# Patient Record
Sex: Male | Born: 1980
Health system: Southern US, Community
[De-identification: ages and names within clinical notes are randomized; demographics above are authoritative.]

## PROBLEM LIST (undated history)

## (undated) DIAGNOSIS — E7800 Pure hypercholesterolemia, unspecified: Secondary | ICD-10-CM

## (undated) DIAGNOSIS — K219 Gastro-esophageal reflux disease without esophagitis: Secondary | ICD-10-CM

## (undated) HISTORY — PX: NOSE SURGERY: SHX723

---
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
## 1999-01-12 HISTORY — PX: ANKLE SURGERY: SHX546

## 1999-01-26 ENCOUNTER — Emergency Department (HOSPITAL_COMMUNITY): Admission: EM | Admit: 1999-01-26 | Discharge: 1999-01-27 | Payer: Self-pay | Admitting: Emergency Medicine

## 2000-01-15 ENCOUNTER — Ambulatory Visit (HOSPITAL_BASED_OUTPATIENT_CLINIC_OR_DEPARTMENT_OTHER): Admission: RE | Admit: 2000-01-15 | Discharge: 2000-01-15 | Payer: Self-pay | Admitting: Orthopedic Surgery

## 2001-11-05 ENCOUNTER — Emergency Department (HOSPITAL_COMMUNITY): Admission: EM | Admit: 2001-11-05 | Discharge: 2001-11-05 | Payer: Self-pay | Admitting: Emergency Medicine

## 2006-04-21 ENCOUNTER — Ambulatory Visit (HOSPITAL_COMMUNITY): Admission: RE | Admit: 2006-04-21 | Discharge: 2006-04-21 | Payer: Self-pay | Admitting: Internal Medicine

## 2008-02-07 ENCOUNTER — Emergency Department (HOSPITAL_COMMUNITY): Admission: EM | Admit: 2008-02-07 | Discharge: 2008-02-07 | Payer: Self-pay | Admitting: Emergency Medicine

## 2010-02-09 ENCOUNTER — Emergency Department (HOSPITAL_COMMUNITY)
Admission: EM | Admit: 2010-02-09 | Discharge: 2010-02-09 | Payer: Self-pay | Source: Home / Self Care | Admitting: Emergency Medicine

## 2010-05-29 NOTE — Op Note (Signed)
[place]  Patient:    David Webb, David Webb                       MRN: [id number] Proc. Date: 01/15/00 Adm. Date:  [id number] Attending:  Colbert Ewing                           Operative Report  PREOPERATIVE DIAGNOSIS:  Displaced lateral malleolus fracture, right ankle.  POSTOPERATIVE DIAGNOSIS:  Displaced lateral malleolus fracture, right ankle.  OPERATION PERFORMED:  Open reduction internal fixation lateral malleolus fracture, right ankle with a 6-hole, one third tubular Depuy titanium plate and screws.  SURGEON:  Loreta Ave, M.D.  ASSISTANT:  Arlys John D. Petrarca, P.A.-C.  ANESTHESIA:  General.  ESTIMATED BLOOD LOSS:  Minimal.  SPECIMENS:  None.  CULTURES:  None.  COMPLICATIONS:  None.  DRESSING:  Soft compressive with Cam walker.  TOURNIQUET TIME:  30 minutes.  DESCRIPTION OF PROCEDURE:  The patient was brought to the operating room and was placed on the operating table in supine position.  After adequate anesthesia had been obtained, tourniquet applied upper aspect right leg. Prepped and draped in the usual sterile fashion.  Exsanguinated with elevation and Esmarch.  Tourniquet inflated to 300 mmHg.  Straight incision over the posterolateral aspect of the fibula.  Subperiosteal exposure of the fibula, cleaning out the fracture site and protecting the peroneal tendons.  Anatomic reduction. A 6-hole one third tubular plate was fashioned to fit on the posterolateral aspect of the fibula.  Held in place with a clamp, holding the fracture reduced anatomically.  Fixed with three screws proximal and three distal to the fracture with one of the middle screws bridging across the fracture and placed in a lag manner.  Care taken not to enter the joint or syndesmosis with fixation or drills.  At completion I had anatomic alignment with solid stable fixation throughout.  This was confirmed visually as well as fluoroscopically.   Because of an old avulsion medially an eversion stress view was obtained  which showed no instability on the medial side.  Full passive motion without fracture site motion after fixation.  Wound irrigated.  Closed with Vicryl and nylon.  Margins of the wound injected with Marcaine.  Sterile compressive dressing applied.  Tourniquet deflated and removed.  Cam walker applied.  Anesthesia reversed.  Brought to recovery room.  Tolerated surgery well.  No complications. DD:  01/15/00 TD:  01/15/00 Job: [id number]

## 2011-04-08 ENCOUNTER — Encounter (INDEPENDENT_AMBULATORY_CARE_PROVIDER_SITE_OTHER): Payer: Self-pay | Admitting: General Surgery

## 2011-04-22 ENCOUNTER — Encounter (INDEPENDENT_AMBULATORY_CARE_PROVIDER_SITE_OTHER): Payer: Self-pay | Admitting: General Surgery

## 2011-04-22 ENCOUNTER — Ambulatory Visit (INDEPENDENT_AMBULATORY_CARE_PROVIDER_SITE_OTHER): Payer: BC Managed Care – PPO | Admitting: General Surgery

## 2011-04-22 VITALS — BP 136/82 | HR 75 | Temp 97.6°F | Ht 65.0 in | Wt 214.2 lb

## 2011-04-22 DIAGNOSIS — K4090 Unilateral inguinal hernia, without obstruction or gangrene, not specified as recurrent: Secondary | ICD-10-CM

## 2011-04-22 NOTE — Progress Notes (Signed)
HPI  David Webb. David Webb is a 31 y.o. male. This patient is known to our practice for a prior evaluation of a left inguinal hernia and left groin pain and was seen initially in 2011 for this. He was diagnosed with a left inguinal hernia and was offered laparoscopic inguinal hernias repair at that time by Dr. Freida Busman. He put off the surgery and since then he says that he has had increasing pain and increasing size of the bulge in his left groin. He first noticed this there half ago after lifting a heavy object and since then he has noticed a bulge in the area. He says this is increasing in size and is increased in size with prolonged standing or sitting. He says that initially this reduced when lying flat however he has had difficulty reducing the bulk lately. He says it's increased the size of a baseball and does cause some stomach pain and some discomfort with certain movements. Otherwise he denies any obstructive symptoms  HPI  Past Medical History   Diagnosis  Date   .  Hyperlipidemia    .  Colon polyp     History reviewed. No pertinent past surgical history.  Family History   Problem  Relation  Age of Onset   .  Stroke  Father    .  Alzheimer's disease  Mother     Social History  History   Substance Use Topics   .  Smoking status:  Never Smoker   .  Smokeless tobacco:  Not on file   .  Alcohol Use:  Yes      rarely    No Known Allergies  No current outpatient prescriptions on file.    Review of Systems  Review of Systems  All other review of systems negative or noncontributory except as stated in the HPI  Blood pressure 144/89, pulse 75, temperature 97.9 F (36.6 C), temperature source Temporal, height 6\' 1"  (1.854 m), weight 183 lb 12.8 oz (83.371 kg), SpO2 98.00%.  Physical Exam  Physical Exam  Physical Exam  Vitals reviewed.  Constitutional: He is oriented to person, place, and time. He appears well-developed and well-nourished. No distress.  HENT:  Head: Normocephalic and  atraumatic.  Mouth/Throat: No oropharyngeal exudate.  Eyes: Conjunctivae and EOM are normal. Pupils are equal, round, and reactive to light. Right eye exhibits no discharge. Left eye exhibits no discharge. No scleral icterus.  Neck: Normal range of motion. No tracheal deviation present.  Cardiovascular: Normal rate, regular rhythm and normal heart sounds.  Pulmonary/Chest: Effort normal and breath sounds normal. No stridor. No respiratory distress. He has no wheezes. He has no rales. He exhibits no tenderness.  Abdominal: Soft. Bowel sounds are normal. He exhibits no distension and no mass. There is no tenderness. There is no rebound and no guarding. Large LIH with descent into the scrotum. It is partially reducible today on exam but nontender and no evidence of strangulation. No RIH Musculoskeletal: Normal range of motion. He exhibits no edema and no tenderness.  Neurological: He is alert and oriented to person, place, and time.  Skin: Skin is warm and dry. No rash noted. He is not diaphoretic. No erythema. No pallor.  Psychiatric: He has a normal mood and affect. His behavior is normal. Judgment and thought content normal.  Data Reviewed  Assessment   Left inguinal hernia  Large Left inguinal hernia which is descending into the scrotum. Because of the size I think that laparoscopic repair is a relative  contraindication to and I think that an open repair would be best for this unilateral, large inguinal hernia. We discussed laparoscopic repair as well but again I think that he would be best suited with an open repair for this larger hernia. Also this is only partially reducible. There is no evidence of strangulation and no obstructive symptoms. We discussed the risks of procedure including infection, bleeding, pain, scarring, recurrence, nerve injury, chronic pain, bowel injury, injury to the testicle and vas deferens and he expressed understanding and desired to proceed with open left inguinal  hernia repair with mesh   Plan   Will plan for open LIH with mesh   [name] DAVID  04/22/2011, 9:56 AM

## 2011-04-26 ENCOUNTER — Ambulatory Visit (INDEPENDENT_AMBULATORY_CARE_PROVIDER_SITE_OTHER): Payer: Self-pay | Admitting: Surgery

## 2011-04-27 ENCOUNTER — Encounter (HOSPITAL_COMMUNITY): Payer: Self-pay | Admitting: Pharmacy Technician

## 2011-04-28 ENCOUNTER — Encounter (HOSPITAL_COMMUNITY)
Admission: RE | Admit: 2011-04-28 | Discharge: 2011-04-28 | Disposition: A | Discharge status: Home / Self Care | Payer: BC Managed Care – PPO | Source: Ambulatory Visit | Attending: General Surgery | Admitting: General Surgery

## 2011-04-28 ENCOUNTER — Encounter (HOSPITAL_COMMUNITY): Payer: Self-pay

## 2011-04-28 VITALS — BP 137/91 | HR 65 | Temp 97.1°F | Resp 20 | Ht 65.0 in | Wt 215.7 lb

## 2011-04-28 DIAGNOSIS — Z01812 Encounter for preprocedural laboratory examination: Secondary | ICD-10-CM | POA: Insufficient documentation

## 2011-04-28 DIAGNOSIS — K4090 Unilateral inguinal hernia, without obstruction or gangrene, not specified as recurrent: Secondary | ICD-10-CM

## 2011-04-28 DIAGNOSIS — Z538 Procedure and treatment not carried out for other reasons: Secondary | ICD-10-CM | POA: Insufficient documentation

## 2011-04-28 HISTORY — DX: Gastro-esophageal reflux disease without esophagitis: K21.9

## 2011-04-28 LAB — BASIC METABOLIC PANEL
BUN: 10 mg/dL (ref 6–23)
Creatinine, Ser: 1.01 mg/dL (ref 0.50–1.35)
GFR calc Af Amer: >90 mL/min (ref >90)
GFR calc non Af Amer: >90 mL/min (ref >90)
Glucose, Bld: 92 mg/dL (ref 70–99)
Potassium: 4.3 mEq/L (ref 3.5–5.1)

## 2011-04-28 LAB — CBC
HCT: 47.9 % (ref 39.0–52.0)
Hemoglobin: 16.5 g/dL (ref 13.0–17.0)
MCH: 30.4 pg (ref 26.0–34.0)
MCHC: 34.4 g/dL (ref 30.0–36.0)
RDW: 12.7 % (ref 11.5–15.5)

## 2011-04-28 LAB — SURGICAL PCR SCREEN
MRSA, PCR: NEGATIVE
Staphylococcus aureus: NEGATIVE

## 2011-04-28 NOTE — Pre-Procedure Instructions (Signed)
20 David Webb  04/28/2011   Your procedure is scheduled on:  Wednesday May 12, 2011  Report to [facility] at 0815 AM.  Call this number if you have problems the morning of surgery: [phone]   Remember:   Do not eat food:After Midnight.  May have clear liquids: up to 4 Hours before arrival. (up to 4:15am)  Clear liquids include soda, tea, black coffee, apple or grape juice, broth.  Take these medicines the morning of surgery with A SIP OF WATER: zyrtec   Do not wear jewelry, make-up or nail polish.  Do not wear lotions, powders, or perfumes. You may wear deodorant.  Do not shave 48 hours prior to surgery.  Do not bring valuables to the hospital.  Contacts, dentures or bridgework may not be worn into surgery.  Leave suitcase in the car. After surgery it may be brought to your room.  For patients admitted to the hospital, checkout time is 11:00 AM the day of discharge.   Patients discharged the day of surgery will not be allowed to drive home.  Name and phone number of your driver: FAMILY\ FRIEND  Special Instructions: CHG Shower Use Special Wash: 1/2 bottle night before surgery and 1/2 bottle morning of surgery.   Please read over the following fact sheets that you were given: Pain Booklet, Coughing and Deep Breathing, MRSA Information and Surgical Site Infection Prevention

## 2011-05-11 MED ORDER — CEFAZOLIN SODIUM-DEXTROSE 2-3 GM-% IV SOLR
2.0000 g | INTRAVENOUS | Status: DC
  Filled 2011-05-11: qty 50

## 2011-05-12 ENCOUNTER — Ambulatory Visit (HOSPITAL_COMMUNITY): Payer: BC Managed Care – PPO

## 2011-05-12 ENCOUNTER — Encounter (HOSPITAL_COMMUNITY)
Admission: RE | Disposition: A | Discharge status: Home / Self Care | Payer: Self-pay | Source: Ambulatory Visit | Attending: General Surgery

## 2011-05-12 ENCOUNTER — Ambulatory Visit (HOSPITAL_COMMUNITY): Payer: BC Managed Care – PPO | Admitting: Anesthesiology

## 2011-05-12 ENCOUNTER — Encounter (HOSPITAL_COMMUNITY): Payer: Self-pay | Admitting: Surgery

## 2011-05-12 ENCOUNTER — Encounter (HOSPITAL_COMMUNITY): Payer: Self-pay | Admitting: Anesthesiology

## 2011-05-12 ENCOUNTER — Ambulatory Visit (HOSPITAL_COMMUNITY)
Admission: RE | Admit: 2011-05-12 | Discharge: 2011-05-12 | Disposition: A | Discharge status: Home / Self Care | Payer: BC Managed Care – PPO | Source: Ambulatory Visit | Attending: General Surgery | Admitting: General Surgery

## 2011-05-12 DIAGNOSIS — K4030 Unilateral inguinal hernia, with obstruction, without gangrene, not specified as recurrent: Secondary | ICD-10-CM

## 2011-05-12 DIAGNOSIS — K4090 Unilateral inguinal hernia, without obstruction or gangrene, not specified as recurrent: Secondary | ICD-10-CM

## 2011-05-12 DIAGNOSIS — K219 Gastro-esophageal reflux disease without esophagitis: Secondary | ICD-10-CM | POA: Insufficient documentation

## 2011-05-12 HISTORY — PX: INGUINAL HERNIA REPAIR: SHX194

## 2011-05-12 SURGERY — REPAIR, HERNIA, INGUINAL, ADULT
Anesthesia: General | Site: Abdomen | Laterality: Left | Wound class: Clean

## 2011-05-12 MED ORDER — ONDANSETRON HCL 4 MG/2ML IJ SOLN
INTRAMUSCULAR | Status: AC
  Administered 2011-05-12: 4 mg
  Filled 2011-05-12: qty 2

## 2011-05-12 MED ORDER — ONDANSETRON HCL 4 MG/2ML IJ SOLN
4.0000 mg | Freq: Once | INTRAMUSCULAR | Status: AC | PRN
  Administered 2011-05-12: 4 mg via INTRAVENOUS

## 2011-05-12 MED ORDER — CEFAZOLIN SODIUM 1-5 GM-% IV SOLN
INTRAVENOUS | Status: DC | PRN
  Administered 2011-05-12: 2 g via INTRAVENOUS

## 2011-05-12 MED ORDER — ROCURONIUM BROMIDE 100 MG/10ML IV SOLN
INTRAVENOUS | Status: DC | PRN
  Administered 2011-05-12: 50 mg via INTRAVENOUS

## 2011-05-12 MED ORDER — HYDROCODONE-ACETAMINOPHEN 5-325 MG PO TABS: 1.0000 | ORAL_TABLET | Freq: Four times a day (QID) | ORAL | Status: AC | PRN

## 2011-05-12 MED ORDER — HYDROMORPHONE HCL PF 1 MG/ML IJ SOLN
0.2500 mg | INTRAMUSCULAR | Status: DC | PRN
  Administered 2011-05-12 (×4): 0.5 mg via INTRAVENOUS

## 2011-05-12 MED ORDER — LACTATED RINGERS IV SOLN
INTRAVENOUS | Status: DC
  Administered 2011-05-12: 14:00:00 via INTRAVENOUS

## 2011-05-12 MED ORDER — HYDROMORPHONE HCL PF 1 MG/ML IJ SOLN
INTRAMUSCULAR | Status: AC
  Filled 2011-05-12: qty 1

## 2011-05-12 MED ORDER — 0.9 % SODIUM CHLORIDE (POUR BTL) OPTIME
TOPICAL | Status: DC | PRN
  Administered 2011-05-12: 1000 mL

## 2011-05-12 MED ORDER — SUCCINYLCHOLINE CHLORIDE 20 MG/ML IJ SOLN
INTRAMUSCULAR | Status: DC | PRN
  Administered 2011-05-12: 100 mg via INTRAVENOUS

## 2011-05-12 MED ORDER — DROPERIDOL 2.5 MG/ML IJ SOLN
INTRAMUSCULAR | Status: AC
  Administered 2011-05-12: 0.625 mg
  Filled 2011-05-12: qty 2

## 2011-05-12 MED ORDER — NEOSTIGMINE METHYLSULFATE 1 MG/ML IJ SOLN
INTRAMUSCULAR | Status: DC | PRN
  Administered 2011-05-12: 5 mg via INTRAVENOUS

## 2011-05-12 MED ORDER — FENTANYL CITRATE 0.05 MG/ML IJ SOLN
INTRAMUSCULAR | Status: DC | PRN
  Administered 2011-05-12: 100 ug via INTRAVENOUS
  Administered 2011-05-12: 50 ug via INTRAVENOUS
  Administered 2011-05-12: 100 ug via INTRAVENOUS

## 2011-05-12 MED ORDER — LACTATED RINGERS IV SOLN
INTRAVENOUS | Status: DC | PRN
  Administered 2011-05-12 (×3): via INTRAVENOUS

## 2011-05-12 MED ORDER — MIDAZOLAM HCL 5 MG/5ML IJ SOLN
INTRAMUSCULAR | Status: DC | PRN
  Administered 2011-05-12: 2 mg via INTRAVENOUS

## 2011-05-12 MED ORDER — GLYCOPYRROLATE 0.2 MG/ML IJ SOLN
INTRAMUSCULAR | Status: DC | PRN
  Administered 2011-05-12: .8 mg via INTRAVENOUS

## 2011-05-12 MED ORDER — MORPHINE SULFATE 2 MG/ML IJ SOLN: 0.0500 mg/kg | INTRAMUSCULAR | Status: DC | PRN

## 2011-05-12 MED ORDER — PROPOFOL 10 MG/ML IV EMUL
INTRAVENOUS | Status: DC | PRN
  Administered 2011-05-12: 100 mg via INTRAVENOUS

## 2011-05-12 MED ORDER — ONDANSETRON HCL 4 MG/2ML IJ SOLN
INTRAMUSCULAR | Status: DC | PRN
  Administered 2011-05-12: 4 mg via INTRAVENOUS

## 2011-05-12 MED ORDER — LIDOCAINE HCL 4 % MT SOLN
OROMUCOSAL | Status: DC | PRN
  Administered 2011-05-12: 4 mL via TOPICAL

## 2011-05-12 MED ORDER — LIDOCAINE-EPINEPHRINE (PF) 1 %-1:200000 IJ SOLN
INTRAMUSCULAR | Status: DC | PRN
  Administered 2011-05-12: 16:00:00

## 2011-05-12 SURGICAL SUPPLY — 50 items
ADH SKN CLS APL DERMABOND .7 (GAUZE/BANDAGES/DRESSINGS) ×1
BLADE SURG 10 STRL SS (BLADE) ×2 IMPLANT
BLADE SURG 15 STRL LF DISP TIS (BLADE) ×1 IMPLANT
BLADE SURG 15 STRL SS (BLADE) ×2
BLADE SURG ROTATE 9660 (MISCELLANEOUS) ×1 IMPLANT
CANISTER SUCTION 2500CC (MISCELLANEOUS) IMPLANT
CHLORAPREP W/TINT 26ML (MISCELLANEOUS) ×2 IMPLANT
CLOTH BEACON ORANGE TIMEOUT ST (SAFETY) ×2 IMPLANT
COVER SURGICAL LIGHT HANDLE (MISCELLANEOUS) ×2 IMPLANT
DERMABOND ADVANCED (GAUZE/BANDAGES/DRESSINGS) ×1
DERMABOND ADVANCED .7 DNX12 (GAUZE/BANDAGES/DRESSINGS) ×1 IMPLANT
DRAIN PENROSE 1/2X12 LTX STRL (WOUND CARE) ×1 IMPLANT
DRAPE LAPAROSCOPIC ABDOMINAL (DRAPES) ×2 IMPLANT
ELECT CAUTERY BLADE 6.4 (BLADE) ×2 IMPLANT
ELECT REM PT RETURN 9FT ADLT (ELECTROSURGICAL) ×2
ELECTRODE REM PT RTRN 9FT ADLT (ELECTROSURGICAL) ×1 IMPLANT
GLOVE BIOGEL PI IND STRL 7.0 (GLOVE) IMPLANT
GLOVE BIOGEL PI INDICATOR 7.0 (GLOVE) ×2
GLOVE ECLIPSE 6.5 STRL STRAW (GLOVE) ×1 IMPLANT
GLOVE SURG SS PI 7.0 STRL IVOR (GLOVE) ×1 IMPLANT
GLOVE SURG SS PI 7.5 STRL IVOR (GLOVE) ×5 IMPLANT
GOWN PREVENTION PLUS XLARGE (GOWN DISPOSABLE) ×2 IMPLANT
GOWN STRL NON-REIN LRG LVL3 (GOWN DISPOSABLE) ×3 IMPLANT
KIT BASIN OR (CUSTOM PROCEDURE TRAY) ×2 IMPLANT
KIT ROOM TURNOVER OR (KITS) ×2 IMPLANT
MANIFOLD NEPTUNE II (INSTRUMENTS) ×1 IMPLANT
MESH ULTRAPRO 3X6 7.6X15CM (Mesh General) ×1 IMPLANT
NDL HYPO 25GX1X1/2 BEV (NEEDLE) ×1 IMPLANT
NEEDLE HYPO 25GX1X1/2 BEV (NEEDLE) ×2 IMPLANT
NS IRRIG 1000ML POUR BTL (IV SOLUTION) ×2 IMPLANT
PACK SURGICAL SETUP 50X90 (CUSTOM PROCEDURE TRAY) ×2 IMPLANT
PAD ARMBOARD 7.5X6 YLW CONV (MISCELLANEOUS) ×2 IMPLANT
PENCIL BUTTON HOLSTER BLD 10FT (ELECTRODE) ×2 IMPLANT
SPECIMEN JAR MEDIUM (MISCELLANEOUS) ×1 IMPLANT
SPECIMEN JAR SMALL (MISCELLANEOUS) IMPLANT
SPONGE INTESTINAL PEANUT (DISPOSABLE) ×2 IMPLANT
SPONGE LAP 18X18 X RAY DECT (DISPOSABLE) ×2 IMPLANT
SUT MNCRL AB 4-0 PS2 18 (SUTURE) ×2 IMPLANT
SUT PROLENE 2 0 SH DA (SUTURE) ×8 IMPLANT
SUT VIC AB 2-0 SH 27 (SUTURE) ×4
SUT VIC AB 2-0 SH 27XBRD (SUTURE) ×2 IMPLANT
SUT VIC AB 3-0 SH 27 (SUTURE) ×4
SUT VIC AB 3-0 SH 27X BRD (SUTURE) ×1 IMPLANT
SUT VICRYL AB 2 0 TIES (SUTURE) ×1 IMPLANT
SYR BULB 3OZ (MISCELLANEOUS) ×2 IMPLANT
SYR CONTROL 10ML LL (SYRINGE) ×2 IMPLANT
TOWEL OR 17X24 6PK STRL BLUE (TOWEL DISPOSABLE) ×2 IMPLANT
TOWEL OR 17X26 10 PK STRL BLUE (TOWEL DISPOSABLE) ×2 IMPLANT
TUBE CONNECTING 12X1/4 (SUCTIONS) ×1 IMPLANT
YANKAUER SUCT BULB TIP NO VENT (SUCTIONS) ×1 IMPLANT

## 2011-05-12 NOTE — H&P (View-Only) (Signed)
HPI  David Webb is a 31 y.o. male. This patient is known to our practice for a prior evaluation of a left inguinal hernia and left groin pain and was seen initially in 2011 for this. He was diagnosed with a left inguinal hernia and was offered laparoscopic inguinal hernias repair at that time by Dr. Allen. He put off the surgery and since then he says that he has had increasing pain and increasing size of the bulge in his left groin. He first noticed this there half ago after lifting a heavy object and since then he has noticed a bulge in the area. He says this is increasing in size and is increased in size with prolonged standing or sitting. He says that initially this reduced when lying flat however he has had difficulty reducing the bulk lately. He says it's increased the size of a baseball and does cause some stomach pain and some discomfort with certain movements. Otherwise he denies any obstructive symptoms  HPI  Past Medical History   Diagnosis  Date   .  Hyperlipidemia    .  Colon polyp     History reviewed. No pertinent past surgical history.  Family History   Problem  Relation  Age of Onset   .  Stroke  Father    .  Alzheimer's disease  Mother     Social History  History   Substance Use Topics   .  Smoking status:  Never Smoker   .  Smokeless tobacco:  Not on file   .  Alcohol Use:  Yes      rarely    No Known Allergies  No current outpatient prescriptions on file.    Review of Systems  Review of Systems  All other review of systems negative or noncontributory except as stated in the HPI  Blood pressure 144/89, pulse 75, temperature 97.9 F (36.6 C), temperature source Temporal, height 6' 1" (1.854 m), weight 183 lb 12.8 oz (83.371 kg), SpO2 98.00%.  Physical Exam  Physical Exam  Physical Exam  Vitals reviewed.  Constitutional: He is oriented to person, place, and time. He appears well-developed and well-nourished. No distress.  HENT:  Head: Normocephalic and  atraumatic.  Mouth/Throat: No oropharyngeal exudate.  Eyes: Conjunctivae and EOM are normal. Pupils are equal, round, and reactive to light. Right eye exhibits no discharge. Left eye exhibits no discharge. No scleral icterus.  Neck: Normal range of motion. No tracheal deviation present.  Cardiovascular: Normal rate, regular rhythm and normal heart sounds.  Pulmonary/Chest: Effort normal and breath sounds normal. No stridor. No respiratory distress. He has no wheezes. He has no rales. He exhibits no tenderness.  Abdominal: Soft. Bowel sounds are normal. He exhibits no distension and no mass. There is no tenderness. There is no rebound and no guarding. Large LIH with descent into the scrotum. It is partially reducible today on exam but nontender and no evidence of strangulation. No RIH Musculoskeletal: Normal range of motion. He exhibits no edema and no tenderness.  Neurological: He is alert and oriented to person, place, and time.  Skin: Skin is warm and dry. No rash noted. He is not diaphoretic. No erythema. No pallor.  Psychiatric: He has a normal mood and affect. His behavior is normal. Judgment and thought content normal.  Data Reviewed  Assessment   Left inguinal hernia  Large Left inguinal hernia which is descending into the scrotum. Because of the size I think that laparoscopic repair is a relative   contraindication to and I think that an open repair would be best for this unilateral, large inguinal hernia. We discussed laparoscopic repair as well but again I think that he would be best suited with an open repair for this larger hernia. Also this is only partially reducible. There is no evidence of strangulation and no obstructive symptoms. We discussed the risks of procedure including infection, bleeding, pain, scarring, recurrence, nerve injury, chronic pain, bowel injury, injury to the testicle and vas deferens and he expressed understanding and desired to proceed with open left inguinal  hernia repair with mesh   Plan   Will plan for open LIH with mesh   Daryn Pisani DAVID  04/22/2011, 9:56 AM  

## 2011-05-12 NOTE — Transfer of Care (Signed)
Immediate Anesthesia Transfer of Care Note  Patient: David Webb  Procedure(s) Performed: Procedure(s) (LRB): HERNIA REPAIR INGUINAL ADULT (Left) INSERTION OF MESH (Left)  Patient Location: PACU  Anesthesia Type: General  Level of Consciousness: awake, alert  and oriented  Airway & Oxygen Therapy: Patient Spontanous Breathing and Patient connected to nasal cannula oxygen  Post-op Assessment: Report given to PACU RN and Post -op Vital signs reviewed and stable  Post vital signs: Reviewed and stable  Complications: No apparent anesthesia complications

## 2011-05-12 NOTE — Brief Op Note (Signed)
Procedure(s): HERNIA REPAIR INGUINAL ADULT INSERTION OF MESH Procedure Note  ROCKFORD LEINEN male 31 y.o. 05/12/2011  Anesthesia: General endotracheal anesthesia   Surgeon(s) and Role:    * Lodema Pilot, DO - Primary   Indications: LIH     Surgeon: Lodema Pilot DAVID   Assistants: none  Anesthesia: General endotracheal anesthesia  ASA Class:     Procedure Detail  HERNIA REPAIR INGUINAL ADULT, INSERTION OF MESH  Findings: Large indirect LIH with omentum (excised) and high ligation performed  Estimated Blood Loss:  less than 100 mL         Drains: none         Total IV Fluids:  Blood Given: none          Specimens: hernia sac and contents         Implants: mesh        Complications:  * No complications entered in OR log *         Disposition: PACU - hemodynamically stable.         Condition: stable

## 2011-05-12 NOTE — Progress Notes (Signed)
DR WYATT NOTIFIED OF CLIENT UNABLE TO VOID AND BLADDER SCAN DONE PER DR WYATT AND DR WYATT NOTIFIED OF 239CC IN BLADDER PER BLADDER SCAN AND PER DR WYATT OK TO D/C HOME

## 2011-05-12 NOTE — Op Note (Signed)
David Webb, Webb                ACCOUNT NO.:  [id number]  MEDICAL RECORD NO.:  [id number]  LOCATION:  [facility]                         FACILITY:  [facility]  PHYSICIAN:  Lodema Pilot, MD       DATE OF BIRTH:  Feb 27, 1980  DATE OF PROCEDURE:  05/12/2011 DATE OF DISCHARGE:                              OPERATIVE REPORT   PROCEDURE:  Open repair of incarcerated left inguinal hernia with mesh.  PREOPERATIVE DIAGNOSIS:  Incarcerated inguinal hernia.  POSTOPERATIVE DIAGNOSIS:  Incarcerated inguinal hernia.  SURGEON:  Lodema Pilot, MD  ASSISTANT:  None.  ANESTHESIA:  General endotracheal tube anesthesia with 30 mL of 1% lidocaine with epinephrine and 0.25% Marcaine in a 50:50 mixture.  FLUIDS:  2 L of crystalloid.  ESTIMATED BLOOD LOSS:  Less than 100 mL.  DRAINS:  None.  SPECIMENS:  Hernia sac and contents sent to Pathology for permanent sectioning.  COMPLICATIONS:  None apparent.  FINDINGS:  Large sac with omental fat.  The sac was opened and the fat was ligated, and high ligation of sac was performed with placement of a 3 inch x 6 inch UltraPro mesh.  INDICATION FOR PROCEDURE:  David Webb is a 31 year old male who has a longstanding left inguinal hernia.  He was seen last year and was offered hernia repair, was elected not to undergo the surgery at that time.  He states it has been increasing in size.  OPERATIVE DETAILS:  David Webb was seen and evaluated in the preoperative area, and risks and benefits of procedure were again discussed in lay terms and informed consent was obtained.  Surgical site was marked prior to anesthetic administration.  During preop discussion, it was concerned that this might be hydrocele, another hernia, so we obtained an ultrasound, which confirmed fat in the scrotum consistent with an inguinal hernia.  He was then taken to the operating room, placed on table in supine position, and general endotracheal anesthesia was obtained.  Prophylactic  antibiotics were given, and his scrotum and groin were prepped in standard surgical fashion.  The procedure time-out was performed with all operative team members to confirm proper patient, procedure, and oblique incision was made in the skin over the inguinal canal.  Dissection carried down through subcutaneous tissue using Bovie electrocautery, and the external oblique fascia was opened along the length of its fibers, and immediately underlying the external oblique fascia was a large inguinal hernia.  It was difficult to identify the ilioinguinal nerve.  Due to the large size of the hernia.  I was unable to reduce the hernia, and so I was able to dissect the around the cord and the hernia at the pubic tubercle.  I opened the cremasteric fibers longitudinally and I was able to identify his cord structures and bluntly separate these from the hernia.  He actually had a very thick inguinal hernia sac, and so the cord structures were actually easy to identify and separate from the hernia sac.  The hernia was pulled up out of the scrotum, and the apex of the hernia sac was identified, and the hernia was completely dissected free from the spermatic cord, and the Penrose was replaced to adjust around  the spermatic cord.  The hernia sac and contents were able to be elevated, and his pendulous sac, which tapered down to a fairly small opening at the at the base of the spermatic cord at the internal ring.  There was no direct inguinal hernia identified.  This was a large indirect inguinal hernia.  After the sac was separated from the cord structures, I opened the hernia sac and was able to inspect the hernia contents.  There was no intestinal contents in the sac, but a large amount of omentum.  I broke this up and divided the omentum at its base with using 3 bites of the omentum and ligating it with Kelly clamps and 2-0 Vicryl ligatures.  After I divided this omental fat, the remainder of the fat  was easily reduced back into the peritoneum, and the hernia sac was ligated at its base with a 2-0 Vicryl stick tie.  Then, the wound was irrigated with sterile saline solution and inspected for hemostasis, which was noted be adequate, and a 3 inch x 6 inch UltraPro mesh was tailored to fit the inguinal canal and sutured in place at the pubic tubercle with a 2-0 Prolene suture, and this was run along the shelving edge of the inguinal ligament and tied laterally to the internal ring then interrupted 2-0 Prolene sutures were used to fix the mesh medially and cephalad, and the tails were split in the lateral aspect of the mesh, and the tails were passed around the spermatic cord, and a new internal ring was created.  2-0 Prolene sutures were used to approximate the tails of the mesh around the spermatic cord creating a new internal ring, and they were overlapping laterally.  The wound was again irrigated with sterile saline solution, and the wound was noted be hemostatic.  The external oblique fascia was then approximated with a running 2-0 Vicryl suture and Scarpa's fascia was approximated with 3-0 Vicryl suture.  The dermis was then approximated with interrupted 3-0 Vicryl sutures and the skin edges were approximated with 4-0 Monocryl subcuticular suture.  Skin was washed and dried and Dermabond was applied.  All sponge, needle, and instrument counts were correct at the end of the case.  The patient tolerated the procedure well without apparent complication.          ______________________________ Lodema Pilot, MD     BL/MEDQ  D:  05/12/2011  T:  05/12/2011  Job:  [id number]

## 2011-05-12 NOTE — Anesthesia Procedure Notes (Signed)
Procedure Name: Intubation Date/Time: 05/12/2011 2:02 PM Performed by: Glendora Score A Pre-anesthesia Checklist: Patient identified, Emergency Drugs available, Suction available and Patient being monitored Patient Re-evaluated:Patient Re-evaluated prior to inductionOxygen Delivery Method: Circle system utilized Preoxygenation: Pre-oxygenation with 100% oxygen Intubation Type: IV induction, Rapid sequence and Cricoid Pressure applied Ventilation: Mask ventilation without difficulty Laryngoscope Size: Miller and 2 Grade View: Grade I Tube type: Oral Tube size: 7.5 mm Number of attempts: 1 Airway Equipment and Method: Stylet and LTA kit utilized Placement Confirmation: ETT inserted through vocal cords under direct vision,  positive ETCO2 and breath sounds checked- equal and bilateral Secured at: 23 cm Tube secured with: Tape Dental Injury: Teeth and Oropharynx as per pre-operative assessment

## 2011-05-12 NOTE — Interval H&P Note (Signed)
Pt seen and examined in preop area and site marked for [facility].  Upon further review, I was concerned that this may be due to possible hydrocele so we obtained US which confirmed fat in inguinal canal and scrotum.  Risks of infection, bleeding, pain, scarring, recurrence, bowel injury, injury to testicle or vas deferens, and nerve injury discussed and he desires to proceed with open LIH with mesh.

## 2011-05-12 NOTE — Discharge Instructions (Signed)
Hernia Repair Care After These instructions give you information on caring for yourself after your procedure. Your doctor may also give you more specific instructions. Call your doctor if you have any problems or questions after your procedure. HOME CARE   You may have changes in your poops (bowel movements).   You may have loose or watery poop (diarrhea).   You may be not able to poop.   Your bowels will slowly get back to normal.   Do not eat any food that makes you sick to your stomach (nauseous). Eat small meals 4 to 6 times a day instead of 3 large ones.   Do not drink pop. It will give you gas.   Do not drink alcohol.   Do not lift anything heavier than 10 pounds. This is about the weight of a gallon of milk.   Do not do anything that makes you very tired for at least 6 weeks.   Do not get your wound wet for 2 days.   You may take a sponge bath during this time.   After 2 days you may take a shower. Gently pat your surgical cut (incision) dry with a towel. Do not rub it.   For men: You may have been given an athletic supporter (scrotal support) before you left the hospital. It holds your scrotum and testicles closer to your body so there is no strain on your wound. Wear the supporter until your doctor tells you that you do not need it anymore.  GET HELP RIGHT AWAY IF:  You have watery poop, or cannot poop for more than 3 days.   You feel sick to your stomach or throw up (vomit) more than 2 or 3 times.   You have temperature by mouth above 102 F (38.9 C).   You see redness or puffiness (swelling) around your wound.   You see yellowish white fluid (pus) coming from your wound.   You see a bulge or bump in your lower belly (abdomen) or near your groin.   You develop a rash, trouble breathing, or any other symptoms from medicines taken.  MAKE SURE YOU:  Understand these instructions.   Will watch your condition.   Will get help right away if your are not doing  well or get worse.  Document Released: 12/11/2007 Document Revised: 12/17/2010 Document Reviewed: 12/11/2007 [employer] Patient Information 2012 [employer]. 

## 2011-05-12 NOTE — Anesthesia Postprocedure Evaluation (Signed)
Anesthesia Post Note  Patient: David Webb  Procedure(s) Performed: Procedure(s) (LRB): HERNIA REPAIR INGUINAL ADULT (Left) INSERTION OF MESH (Left)  Anesthesia type: general  Patient location: PACU  Post pain: Pain level controlled  Post assessment: Patient's Cardiovascular Status Stable  Last Vitals:  Filed Vitals:   05/12/11 1700  BP: 130/65  Pulse:   Temp:   Resp:     Post vital signs: Reviewed and stable  Level of consciousness: sedated  Complications: No apparent anesthesia complications

## 2011-05-12 NOTE — Anesthesia Preprocedure Evaluation (Addendum)
Anesthesia Evaluation  Patient identified by MRN, date of birth, ID band Patient awake    Reviewed: Allergy & Precautions, H&P , NPO status   Airway Mallampati: I TM Distance: >3 FB Neck ROM: Full  Mouth opening: Limited Mouth Opening  Dental   Pulmonary          Cardiovascular     Neuro/Psych    GI/Hepatic GERD-  Poorly Controlled,  Endo/Other    Renal/GU      Musculoskeletal   Abdominal   Peds  Hematology   Anesthesia Other Findings   Reproductive/Obstetrics                           Anesthesia Physical Anesthesia Plan  ASA: II  Anesthesia Plan: General   Post-op Pain Management:    Induction: Intravenous, Rapid sequence and Cricoid pressure planned  Airway Management Planned: Oral ETT  Additional Equipment:   Intra-op Plan:   Post-operative Plan: Extubation in OR  Informed Consent: I have reviewed the patients History and Physical, chart, labs and discussed the procedure including the risks, benefits and alternatives for the proposed anesthesia with the patient or authorized representative who has indicated his/her understanding and acceptance.   Dental advisory given  Plan Discussed with: CRNA, Surgeon and Anesthesiologist  Anesthesia Plan Comments:        Anesthesia Quick Evaluation

## 2011-05-12 NOTE — Preoperative (Signed)
Beta Blockers   Reason not to administer Beta Blockers:Not Applicable 

## 2011-05-13 NOTE — Progress Notes (Signed)
Patient states he is still having severe pain. Patient reports he has stronger hydrocodone from dental procedure. Advised patient that if he was going to take it to follow directions exactly and to contact Dr. Delice Lesch office in am

## 2011-05-14 ENCOUNTER — Telehealth (INDEPENDENT_AMBULATORY_CARE_PROVIDER_SITE_OTHER): Payer: Self-pay

## 2011-05-14 NOTE — Telephone Encounter (Signed)
The patient called regarding his postoperative pain.  He is taking Hydrocodone 5/325 and it isn't strong enough for his pain.  He has some Hydrocodone 7.5/500 that does help.  He has no fever but has some swelling and bruising.  He can't take Percocet because it causes him to sweat and have dreams.  I paged Dr Biagio Quint and he said he can take the 7.5.  I asked if I can call in some if he needs it and says whatever he needs is fine.  I called in Hydrocodone 7.5/500 take 1 po q 4-6 hrs prn pain #30 no refills to [facility] on [address].  Pt was notified.

## 2011-05-18 ENCOUNTER — Encounter (HOSPITAL_COMMUNITY): Payer: Self-pay | Admitting: General Surgery

## 2011-05-31 ENCOUNTER — Ambulatory Visit (INDEPENDENT_AMBULATORY_CARE_PROVIDER_SITE_OTHER): Payer: BC Managed Care – PPO | Admitting: General Surgery

## 2011-05-31 ENCOUNTER — Encounter (INDEPENDENT_AMBULATORY_CARE_PROVIDER_SITE_OTHER): Payer: Self-pay | Admitting: General Surgery

## 2011-05-31 VITALS — BP 132/86 | HR 62 | Temp 97.8°F | Resp 16 | Ht 65.0 in | Wt 213.0 lb

## 2011-05-31 DIAGNOSIS — Z5189 Encounter for other specified aftercare: Secondary | ICD-10-CM

## 2011-05-31 DIAGNOSIS — Z4889 Encounter for other specified surgical aftercare: Secondary | ICD-10-CM

## 2011-05-31 NOTE — Progress Notes (Signed)
Subjective:     Patient ID: AMER ALCINDOR, male   DOB: Mar 14, 1980, 31 y.o.   MRN: [id number]  HPI This patient has a three-week status post open repair of incarcerated left inguinal hernia. He seems to be doing well and has noticed recurrence his only complaints are some lack of appetite and some numbness and burning in the crease of his groin. He has returned to work but has been having coworkers due to lifting for him.  Review of Systems     Objective:   Physical Exam No acute distress and nontoxic-appearing  His incision is healing well without infection. He has no evidence of recurrent hernia. No evidence of postoperative complications.    Assessment:     Status post open repair of incarcerated left inguinal hernia He has some postoperative numbness and burning which is likely due to nerve transection. I explained that this may or may not improve over time. I think that is anorexia will likely improve with more time. There is no evidence of any postoperative complications and overall seems to be doing well. I recommended that he continue light duty for another week and then he can gradually increase his activity as tolerated.    Plan:     F/u prn

## 2011-06-22 ENCOUNTER — Other Ambulatory Visit (HOSPITAL_COMMUNITY): Payer: BC Managed Care – PPO

## 2013-01-22 ENCOUNTER — Other Ambulatory Visit: Payer: Self-pay | Admitting: Family Medicine

## 2013-01-22 DIAGNOSIS — R748 Abnormal levels of other serum enzymes: Secondary | ICD-10-CM

## 2013-01-23 ENCOUNTER — Ambulatory Visit
Admission: RE | Admit: 2013-01-23 | Discharge: 2013-01-23 | Disposition: A | Discharge status: Home / Self Care | Payer: BC Managed Care – PPO | Source: Ambulatory Visit | Attending: Family Medicine | Admitting: Family Medicine

## 2013-01-23 DIAGNOSIS — R748 Abnormal levels of other serum enzymes: Secondary | ICD-10-CM

## 2014-10-16 ENCOUNTER — Emergency Department (HOSPITAL_COMMUNITY)
Admission: EM | Admit: 2014-10-16 | Discharge: 2014-10-16 | Disposition: A | Discharge status: Home / Self Care | Payer: BLUE CROSS/BLUE SHIELD | Attending: Emergency Medicine | Admitting: Emergency Medicine

## 2014-10-16 ENCOUNTER — Encounter (HOSPITAL_COMMUNITY): Payer: Self-pay | Admitting: *Deleted

## 2014-10-16 ENCOUNTER — Emergency Department (HOSPITAL_COMMUNITY): Payer: BLUE CROSS/BLUE SHIELD

## 2014-10-16 DIAGNOSIS — Z79899 Other long term (current) drug therapy: Secondary | ICD-10-CM | POA: Insufficient documentation

## 2014-10-16 DIAGNOSIS — I10 Essential (primary) hypertension: Secondary | ICD-10-CM | POA: Diagnosis not present

## 2014-10-16 DIAGNOSIS — R079 Chest pain, unspecified: Secondary | ICD-10-CM | POA: Diagnosis not present

## 2014-10-16 DIAGNOSIS — Z8639 Personal history of other endocrine, nutritional and metabolic disease: Secondary | ICD-10-CM | POA: Diagnosis not present

## 2014-10-16 DIAGNOSIS — K219 Gastro-esophageal reflux disease without esophagitis: Secondary | ICD-10-CM | POA: Insufficient documentation

## 2014-10-16 DIAGNOSIS — R55 Syncope and collapse: Secondary | ICD-10-CM

## 2014-10-16 HISTORY — DX: Pure hypercholesterolemia, unspecified: E78.00

## 2014-10-16 LAB — BASIC METABOLIC PANEL
ANION GAP: 12 (ref 5–15)
BUN: 9 mg/dL (ref 6–20)
CO2: 24 mmol/L (ref 22–32)
Calcium: 9.1 mg/dL (ref 8.9–10.3)
Chloride: 98 mmol/L — ABNORMAL LOW (ref 101–111)
Creatinine, Ser: 1.14 mg/dL (ref 0.61–1.24)
GLUCOSE: 103 mg/dL — AB (ref 65–99)
POTASSIUM: 3.5 mmol/L (ref 3.5–5.1)
Sodium: 134 mmol/L — ABNORMAL LOW (ref 135–145)

## 2014-10-16 LAB — CBC
HEMATOCRIT: 47.9 % (ref 39.0–52.0)
HEMOGLOBIN: 17.1 g/dL — AB (ref 13.0–17.0)
MCH: 31.4 pg (ref 26.0–34.0)
MCHC: 35.7 g/dL (ref 30.0–36.0)
MCV: 88.1 fL (ref 78.0–100.0)
Platelets: 322 10*3/uL (ref 150–400)
RBC: 5.44 MIL/uL (ref 4.22–5.81)
RDW: 12.7 % (ref 11.5–15.5)
WBC: 11.7 10*3/uL — AB (ref 4.0–10.5)

## 2014-10-16 LAB — TROPONIN I

## 2014-10-16 LAB — I-STAT TROPONIN, ED: Troponin i, poc: 0 ng/mL (ref 0.00–0.08)

## 2014-10-16 LAB — D-DIMER, QUANTITATIVE (NOT AT ARMC)

## 2014-10-16 MED ORDER — ONDANSETRON HCL 4 MG/2ML IJ SOLN
4.0000 mg | Freq: Once | INTRAMUSCULAR | Status: AC
  Administered 2014-10-16: 4 mg via INTRAVENOUS
  Filled 2014-10-16: qty 2

## 2014-10-16 MED ORDER — OMEPRAZOLE 20 MG PO CPDR: 20.0000 mg | DELAYED_RELEASE_CAPSULE | Freq: Every day | ORAL | Status: AC

## 2014-10-16 MED ORDER — SODIUM CHLORIDE 0.9 % IV BOLUS (SEPSIS)
1000.0000 mL | Freq: Once | INTRAVENOUS | Status: AC
  Administered 2014-10-16: 1000 mL via INTRAVENOUS

## 2014-10-16 MED ORDER — LORAZEPAM 2 MG/ML IJ SOLN
0.5000 mg | Freq: Once | INTRAMUSCULAR | Status: DC
  Filled 2014-10-16: qty 1

## 2014-10-16 NOTE — ED Notes (Signed)
PA at bedside.

## 2014-10-16 NOTE — ED Notes (Signed)
Pt able to ambulate independently 

## 2014-10-16 NOTE — Discharge Instructions (Signed)

## 2014-10-16 NOTE — ED Notes (Signed)
Pt continues to ask RN to remove IV.  This RN has explained why we should keep it in until his dc.  Patient okay at this time, but states he is ready to go and he can "follow up with his PCP"

## 2014-10-16 NOTE — ED Notes (Signed)
Pt ambulated in hall independently and toileted independently

## 2014-10-16 NOTE — ED Notes (Signed)
Pt states felt near syncopal today and then developed chest pressure and nausea.

## 2014-10-16 NOTE — ED Provider Notes (Signed)
CSN: [id number]     Arrival date & time 10/16/14  1450 History   First MD Initiated Contact with Patient 10/16/14 1526     Chief Complaint  Patient presents with  . Near Syncope  . Chest Pain     (Consider location/radiation/quality/duration/timing/severity/associated sxs/prior Treatment) HPI   PCP: No PCP Per Patient PMH: hypertension, hyperlipidemia, hx of GERD and near syncopal episodes in the past  David Webb is a 34 y.o.  male  CHIEF COMPLAINT: chest pain and near syncopal episode x 2  Patient ate breakfast and went to work at [employer] feeling normal. He went to lunch at 1 pm and while ordering his food he felt as though he may pass out, he described it as weighing 300 lbs, feeling weak, nauseous and sweaty. He then developed sharp substernal CP. His symptoms improved after a few minutes, he ate half of his lunch. He was then in the [place] when the episode happened a second time, he then called his buddy to bring him to the ER. Once he got here the symptoms completely resolved. While in the exam room he had an episode of of sharp chest pain that lasted a minute but without the near syncope, diaphoresis or nausea.     ROS: The patient denies fever, headache, weakness ( focal), confusion, change of vision,  dysphagia, aphagia, shortness of breath,  abdominal pains, nausea, vomiting, diarrhea, lower extremity swelling, rash, neck pain,    Past Medical History  Diagnosis Date  . GERD (gastroesophageal reflux disease)     OCC TUMS  . Hypercholesterolemia    Past Surgical History  Procedure Laterality Date  . Ankle surgery  2001  . Nose surgery      AGE 44   . Inguinal hernia repair  05/12/2011    Procedure: HERNIA REPAIR INGUINAL ADULT;  Surgeon: Madilyn Hook, DO;  Location: [facility];  Service: General;  Laterality: Left;   No family history on file. Social History  Substance Use Topics  . Smoking status: Never Smoker   . Smokeless tobacco: None  . Alcohol Use: 3.6 oz/week    6  Cans of beer per week     Comment: weekly    Review of Systems  10 Systems reviewed and are negative for acute change except as noted in the HPI.    Allergies  Percocet  Home Medications   Prior to Admission medications   Medication Sig Start Date End Date Taking? Authorizing Provider  cetirizine (ZYRTEC) 10 MG tablet Take 10 mg by mouth daily as needed. For allergies    Historical Provider, MD  omeprazole (PRILOSEC) 20 MG capsule Take 1 capsule (20 mg total) by mouth daily. 10/16/14   Vallarie Fei Carlota Raspberry, PA-C   BP 119/64 mmHg  Pulse 69  Temp(Src) 97.8 F (36.6 C) (Oral)  Resp 18  SpO2 98% Physical Exam  Constitutional: He appears well-developed and well-nourished. No distress.  HENT:  Head: Normocephalic and atraumatic.  Eyes: Pupils are equal, round, and reactive to light.  Neck: Normal range of motion. Neck supple.  Cardiovascular: Normal rate and regular rhythm.   Pulmonary/Chest: Effort normal. No respiratory distress. He has no wheezes. He has no rales.  Pain is not reproducible to pressure.  Abdominal: Soft. There is no tenderness.  Musculoskeletal:  No lower extremity swelling  Neurological: He is alert.  Skin: Skin is warm and dry.  Nursing note and vitals reviewed.   ED Course  Procedures (including critical care time) Labs Review Labs  Reviewed  BASIC METABOLIC PANEL - Abnormal; Notable for the following:    Sodium 134 (*)    Chloride 98 (*)    Glucose, Bld 103 (*)    All other components within normal limits  CBC - Abnormal; Notable for the following:    WBC 11.7 (*)    Hemoglobin 17.1 (*)    All other components within normal limits  D-DIMER, QUANTITATIVE (NOT AT [facility])  TROPONIN I  Randolm Idol, ED    Imaging Review Dg Chest 2 View  10/16/2014   CLINICAL DATA:  Chest pain  EXAM: CHEST  2 VIEW  COMPARISON:  None.  FINDINGS: The heart size and mediastinal contours are within normal limits. Both lungs are clear. The visualized skeletal structures  are unremarkable.  IMPRESSION: No active cardiopulmonary disease.   Electronically Signed   By: Franchot Gallo M.D.   On: 10/16/2014 15:30   I have personally reviewed and evaluated these images and lab results as part of my medical decision-making.   EKG Interpretation   Date/Time:  Wednesday October 16 2014 14:59:14 EDT Ventricular Rate:  99 PR Interval:  154 QRS Duration: 86 QT Interval:  346 QTC Calculation: 444 R Axis:   61 Text Interpretation:  Normal sinus rhythm with sinus arrhythmia Normal ECG  Confirmed by Hazle Coca [phone]) on 10/16/2014 3:20:17 PM      MDM   Final diagnoses:  Near syncope    Patient has normal orthostatics During his emergency department visit he has had no recurring episodes of pain or near syncope The patient is requesting to leave, I advised that we need to check a d-dimer and a delta  Troponin before I feel comfortable allowing him to go.  His chest xray, first and second trop, d-dimer, BMP and CBC are unremarkable for acute findings. I discussed with the patient has a etiology of his near-syncope is unclear. The patient did not lose consciousness that he feels fine and would like to go home. The differential dx is extensive for his symptoms. He has a PCP physical coming up in one week. Since his labs are normal, he has had no further episodes and is requesting to leave I will allow him to do so. He voices that he understands the etiology is unclear and that his symptoms may reoccur.  Medications  LORazepam (ATIVAN) injection 0.5 mg (0.5 mg Intravenous Not Given 10/16/14 1812)  ondansetron (ZOFRAN) injection 4 mg (4 mg Intravenous Given 10/16/14 1551)  sodium chloride 0.9 % bolus 1,000 mL (0 mLs Intravenous Stopped 10/16/14 1755)    34 y.o.David Webb's medical screening exam was performed and I feel the patient has had an appropriate workup for their chief complaint at this time and likelihood of emergent condition existing is low. They have been  counseled on decision, discharge, follow up and which symptoms necessitate immediate return to the emergency department. They or their family verbally stated understanding and agreement with plan and discharged in stable condition.   Vital signs are stable at discharge. Filed Vitals:   10/16/14 1730  BP: 119/64  Pulse: 69  Temp:   Resp: [address] PA-C 10/16/14 6045  Pattricia Boss, MD 10/16/14 [phone]

## 2014-10-23 ENCOUNTER — Ambulatory Visit: Payer: Self-pay | Admitting: Family Medicine

## 2016-04-20 DIAGNOSIS — Z0000 Encounter for general adult medical examination without abnormal findings: Secondary | ICD-10-CM | POA: Diagnosis not present

## 2016-04-20 DIAGNOSIS — R5383 Other fatigue: Secondary | ICD-10-CM | POA: Diagnosis not present

## 2016-04-20 DIAGNOSIS — R0789 Other chest pain: Secondary | ICD-10-CM | POA: Diagnosis not present

## 2016-04-20 DIAGNOSIS — E7800 Pure hypercholesterolemia, unspecified: Secondary | ICD-10-CM | POA: Diagnosis not present

## 2016-05-04 DIAGNOSIS — E7800 Pure hypercholesterolemia, unspecified: Secondary | ICD-10-CM | POA: Diagnosis not present

## 2016-05-04 DIAGNOSIS — K219 Gastro-esophageal reflux disease without esophagitis: Secondary | ICD-10-CM | POA: Diagnosis not present

## 2016-09-07 DIAGNOSIS — J8410 Pulmonary fibrosis, unspecified: Secondary | ICD-10-CM | POA: Diagnosis not present

## 2016-09-07 DIAGNOSIS — Z836 Family history of other diseases of the respiratory system: Secondary | ICD-10-CM | POA: Diagnosis not present

## 2016-09-07 DIAGNOSIS — E7800 Pure hypercholesterolemia, unspecified: Secondary | ICD-10-CM | POA: Diagnosis not present

## 2016-09-29 DIAGNOSIS — K219 Gastro-esophageal reflux disease without esophagitis: Secondary | ICD-10-CM | POA: Diagnosis not present

## 2016-09-29 DIAGNOSIS — E7800 Pure hypercholesterolemia, unspecified: Secondary | ICD-10-CM | POA: Diagnosis not present

## 2016-11-03 DIAGNOSIS — R110 Nausea: Secondary | ICD-10-CM | POA: Diagnosis not present

## 2016-11-03 DIAGNOSIS — M778 Other enthesopathies, not elsewhere classified: Secondary | ICD-10-CM | POA: Diagnosis not present

## 2016-11-03 DIAGNOSIS — M79602 Pain in left arm: Secondary | ICD-10-CM | POA: Diagnosis not present

## 2016-11-03 DIAGNOSIS — M25562 Pain in left knee: Secondary | ICD-10-CM | POA: Diagnosis not present

## 2016-11-03 DIAGNOSIS — E7800 Pure hypercholesterolemia, unspecified: Secondary | ICD-10-CM | POA: Diagnosis not present

## 2017-05-02 DIAGNOSIS — E559 Vitamin D deficiency, unspecified: Secondary | ICD-10-CM | POA: Diagnosis not present

## 2017-05-02 DIAGNOSIS — Z0000 Encounter for general adult medical examination without abnormal findings: Secondary | ICD-10-CM | POA: Diagnosis not present

## 2017-05-02 DIAGNOSIS — E7800 Pure hypercholesterolemia, unspecified: Secondary | ICD-10-CM | POA: Diagnosis not present

## 2017-05-10 DIAGNOSIS — E7800 Pure hypercholesterolemia, unspecified: Secondary | ICD-10-CM | POA: Diagnosis not present

## 2017-05-10 DIAGNOSIS — Z0000 Encounter for general adult medical examination without abnormal findings: Secondary | ICD-10-CM | POA: Diagnosis not present

## 2017-08-23 DIAGNOSIS — E7800 Pure hypercholesterolemia, unspecified: Secondary | ICD-10-CM | POA: Diagnosis not present

## 2017-08-23 DIAGNOSIS — M25522 Pain in left elbow: Secondary | ICD-10-CM | POA: Diagnosis not present

## 2017-08-23 DIAGNOSIS — M545 Low back pain: Secondary | ICD-10-CM | POA: Diagnosis not present

## 2017-10-27 DIAGNOSIS — C44319 Basal cell carcinoma of skin of other parts of face: Secondary | ICD-10-CM | POA: Diagnosis not present

## 2017-10-27 DIAGNOSIS — C4441 Basal cell carcinoma of skin of scalp and neck: Secondary | ICD-10-CM | POA: Diagnosis not present

## 2017-10-27 DIAGNOSIS — D1801 Hemangioma of skin and subcutaneous tissue: Secondary | ICD-10-CM | POA: Diagnosis not present

## 2017-10-27 DIAGNOSIS — C44519 Basal cell carcinoma of skin of other part of trunk: Secondary | ICD-10-CM | POA: Diagnosis not present

## 2017-12-12 DIAGNOSIS — C44319 Basal cell carcinoma of skin of other parts of face: Secondary | ICD-10-CM | POA: Diagnosis not present

## 2018-05-09 DIAGNOSIS — Z0000 Encounter for general adult medical examination without abnormal findings: Secondary | ICD-10-CM | POA: Diagnosis not present

## 2018-05-09 DIAGNOSIS — E7800 Pure hypercholesterolemia, unspecified: Secondary | ICD-10-CM | POA: Diagnosis not present

## 2018-05-16 DIAGNOSIS — M79641 Pain in right hand: Secondary | ICD-10-CM | POA: Diagnosis not present

## 2018-05-16 DIAGNOSIS — M79642 Pain in left hand: Secondary | ICD-10-CM | POA: Diagnosis not present

## 2018-05-16 DIAGNOSIS — Z0000 Encounter for general adult medical examination without abnormal findings: Secondary | ICD-10-CM | POA: Diagnosis not present

## 2018-05-24 DIAGNOSIS — L570 Actinic keratosis: Secondary | ICD-10-CM | POA: Diagnosis not present

## 2018-05-24 DIAGNOSIS — M79642 Pain in left hand: Secondary | ICD-10-CM | POA: Diagnosis not present

## 2018-05-24 DIAGNOSIS — M064 Inflammatory polyarthropathy: Secondary | ICD-10-CM | POA: Diagnosis not present

## 2019-03-30 ENCOUNTER — Ambulatory Visit: Payer: BLUE CROSS/BLUE SHIELD | Attending: Internal Medicine

## 2019-03-30 ENCOUNTER — Other Ambulatory Visit: Payer: Self-pay

## 2019-03-30 DIAGNOSIS — Z23 Encounter for immunization: Secondary | ICD-10-CM

## 2019-03-30 NOTE — Progress Notes (Signed)
   Covid-19 Vaccination Clinic  Name:  PIERCESON CERRO    MRN: [id number] DOB: [birth year]  03/30/2019  Mr. Gerges was observed post Covid-19 immunization for 15 minutes without incident. He was provided with Vaccine Information Sheet and instruction to access the V-Safe system.   Mr. Zizzo was instructed to call 911 with any severe reactions post vaccine: Marland Kitchen Difficulty breathing  . Swelling of face and throat  . A fast heartbeat  . A bad rash all over body  . Dizziness and weakness   Immunizations Administered    Name Date Dose VIS Date Route   Pfizer COVID-19 Vaccine 03/30/2019 11:28 AM 0.3 mL 12/22/2018 Intramuscular   Manufacturer: [place]   Lot: [id number]   [place]: [id number]

## 2019-04-24 ENCOUNTER — Ambulatory Visit: Payer: BLUE CROSS/BLUE SHIELD | Attending: Internal Medicine

## 2019-04-24 DIAGNOSIS — Z23 Encounter for immunization: Secondary | ICD-10-CM

## 2019-04-24 NOTE — Progress Notes (Signed)
   Covid-19 Vaccination Clinic  Name:  David Webb    MRN: [id number] DOB: [birth year]  04/24/2019  [name] was observed post Covid-19 immunization for 15 minutes without incident. He was provided with Vaccine Information Sheet and instruction to access the V-Safe system.   [name] was instructed to call 911 with any severe reactions post vaccine: Marland Kitchen Difficulty breathing  . Swelling of face and throat  . A fast heartbeat  . A bad rash all over body  . Dizziness and weakness   Immunizations Administered    Name Date Dose VIS Date Route   Pfizer COVID-19 Vaccine 04/24/2019 12:31 PM 0.3 mL 12/22/2018 Intramuscular   Manufacturer: [place]   Lot: [id number]   [place]: [id number]

## 2019-05-15 DIAGNOSIS — E7800 Pure hypercholesterolemia, unspecified: Secondary | ICD-10-CM | POA: Diagnosis not present

## 2019-05-15 DIAGNOSIS — E559 Vitamin D deficiency, unspecified: Secondary | ICD-10-CM | POA: Diagnosis not present

## 2019-05-15 DIAGNOSIS — Z0000 Encounter for general adult medical examination without abnormal findings: Secondary | ICD-10-CM | POA: Diagnosis not present

## 2019-05-22 DIAGNOSIS — Z0000 Encounter for general adult medical examination without abnormal findings: Secondary | ICD-10-CM | POA: Diagnosis not present

## 2019-08-06 DIAGNOSIS — M7989 Other specified soft tissue disorders: Secondary | ICD-10-CM | POA: Diagnosis not present

## 2019-08-06 DIAGNOSIS — M2550 Pain in unspecified joint: Secondary | ICD-10-CM | POA: Diagnosis not present

## 2019-11-30 DIAGNOSIS — E7800 Pure hypercholesterolemia, unspecified: Secondary | ICD-10-CM | POA: Diagnosis not present

## 2019-11-30 DIAGNOSIS — R5383 Other fatigue: Secondary | ICD-10-CM | POA: Diagnosis not present

## 2019-12-03 DIAGNOSIS — L03115 Cellulitis of right lower limb: Secondary | ICD-10-CM | POA: Diagnosis not present

## 2020-02-25 ENCOUNTER — Ambulatory Visit
Admission: EM | Admit: 2020-02-25 | Discharge: 2020-02-25 | Disposition: A | Discharge status: Home / Self Care | Payer: BC Managed Care – PPO | Attending: Family Medicine | Admitting: Family Medicine

## 2020-02-25 ENCOUNTER — Encounter: Payer: Self-pay | Admitting: Emergency Medicine

## 2020-02-25 DIAGNOSIS — L03115 Cellulitis of right lower limb: Secondary | ICD-10-CM

## 2020-02-25 MED ORDER — DOXYCYCLINE HYCLATE 100 MG PO CAPS: 100.0000 mg | ORAL_CAPSULE | Freq: Two times a day (BID) | ORAL | 0 refills | Status: AC

## 2020-02-25 NOTE — ED Triage Notes (Signed)
Area above RT knee that is red, warm and painful since Saturday night.  Pt concerned it may be a spider bite.

## 2020-02-25 NOTE — ED Provider Notes (Signed)
[facility]    CSN: [id number] Arrival date & time: 02/25/20  1903      History   Chief Complaint Chief Complaint  Patient presents with  . Insect Bite    HPI David Webb is a 40 y.o. male.   HPI  Patient presents today for evaluation of an insect bite involving the right lateral side of his leg.  Patient reports he was in the woods over the weekends with his children and following that activity he noticed a slight red area.  He did mark the area the area has increased in diameter, increased in warmth and tenderness it is more deep finger lateral side of the knee.  He had a previous similar cellulitis type infection after being bit by an insect last year.  He denies any nausea, vomiting, diarrhea or fever  Past Medical History:  Diagnosis Date  . GERD (gastroesophageal reflux disease)    OCC TUMS  . Hypercholesterolemia     There are no problems to display for this patient.   Past Surgical History:  Procedure Laterality Date  . ANKLE SURGERY  2001  . INGUINAL HERNIA REPAIR  05/12/2011   Procedure: HERNIA REPAIR INGUINAL ADULT;  Surgeon: Madilyn Hook, DO;  Location: [facility];  Service: General;  Laterality: Left;  . NOSE SURGERY     AGE 32        Home Medications    Prior to Admission medications   Medication Sig Start Date End Date Taking? Authorizing Provider  doxycycline (VIBRAMYCIN) 100 MG capsule Take 1 capsule (100 mg total) by mouth 2 (two) times daily for 7 days. 02/25/20 03/03/20 Yes Scot Jun, FNP  cetirizine (ZYRTEC) 10 MG tablet Take 10 mg by mouth daily as needed. For allergies    [provider]  omeprazole (PRILOSEC) 20 MG capsule Take 1 capsule (20 mg total) by mouth daily. 10/16/14   Delos Haring, PA-C    Family History No family history on file.  Social History Social History   Tobacco Use  . Smoking status: Never Smoker  . Smokeless tobacco: Never Used  Substance Use Topics  . Alcohol use: Yes     Alcohol/week: 6.0 standard drinks    Types: 6 Cans of beer per week    Comment: weekly  . Drug use: No     Allergies   Percocet [oxycodone-acetaminophen]   Review of Systems Review of Systems   Physical Exam Triage Vital Signs ED Triage Vitals [02/25/20 1915]  Enc Vitals Group     BP      Pulse      Resp      Temp      Temp src      SpO2      Weight      Height      Head Circumference      Peak Flow      Pain Score 4     Pain Loc      Pain Edu?      Excl. in [place]?    No data found.  Updated Vital Signs There were no vitals taken for this visit.  Visual Acuity Right Eye Distance:   Left Eye Distance:   Bilateral Distance:    Right Eye Near:   Left Eye Near:    Bilateral Near:     Physical Exam General appearance: alert, well developed, well nourished, cooperative and in no distress Head: Normocephalic, without obvious abnormality, atraumatic Respiratory: Respirations even and  unlabored, normal respiratory rate Heart: rate and rhythm normal. No gallop or murmurs noted on exam  Abdomen: BS +, no distention, no rebound tenderness, or no mass Extremities: Right lateral knee nodular cystic lesion impression question of warm erythematous tender to touch nonfluctuating  Psych: Appropriate mood and affect. Neurologic: Mental status: Alert, oriented to person, place, and time, thought content appropriate.   UC Treatments / Results  Labs (all labs ordered are listed, but only abnormal results are displayed) Labs Reviewed - No data to display  EKG   Radiology No results found.  Procedures Procedures (including critical care time)  Medications Ordered in UC Medications - No data to display  Initial Impression / Assessment and Plan / UC Course  I have reviewed the triage vital signs and the nursing notes.  Pertinent labs & imaging results that were available during my care of the patient were reviewed by me and considered in my medical decision making  (see chart for details).     Doxycycline 100 mg twice daily for 7 days.  Continue warm compresses.  Continue to monitor for signs of worsening infection.  Return precautions discussed. Final Clinical Impressions(s) / UC Diagnoses   Final diagnoses:  Cellulitis of leg, right   Discharge Instructions   None    ED Prescriptions    Medication Sig Dispense Auth. Provider   doxycycline (VIBRAMYCIN) 100 MG capsule Take 1 capsule (100 mg total) by mouth 2 (two) times daily for 7 days. 14 capsule Scot Jun, FNP     PDMP not reviewed this encounter.   Scot Jun, FNP 02/25/20 1934

## 2020-05-23 DIAGNOSIS — F419 Anxiety disorder, unspecified: Secondary | ICD-10-CM | POA: Diagnosis not present

## 2020-05-23 DIAGNOSIS — E559 Vitamin D deficiency, unspecified: Secondary | ICD-10-CM | POA: Diagnosis not present

## 2020-05-23 DIAGNOSIS — E669 Obesity, unspecified: Secondary | ICD-10-CM | POA: Diagnosis not present

## 2020-05-23 DIAGNOSIS — Z0000 Encounter for general adult medical examination without abnormal findings: Secondary | ICD-10-CM | POA: Diagnosis not present

## 2020-05-23 DIAGNOSIS — E7800 Pure hypercholesterolemia, unspecified: Secondary | ICD-10-CM | POA: Diagnosis not present

## 2020-05-23 DIAGNOSIS — M064 Inflammatory polyarthropathy: Secondary | ICD-10-CM | POA: Diagnosis not present

## 2020-05-28 DIAGNOSIS — Z8709 Personal history of other diseases of the respiratory system: Secondary | ICD-10-CM | POA: Diagnosis not present

## 2020-05-28 DIAGNOSIS — M25572 Pain in left ankle and joints of left foot: Secondary | ICD-10-CM | POA: Diagnosis not present

## 2020-05-28 DIAGNOSIS — Z0000 Encounter for general adult medical examination without abnormal findings: Secondary | ICD-10-CM | POA: Diagnosis not present

## 2020-05-28 DIAGNOSIS — Z836 Family history of other diseases of the respiratory system: Secondary | ICD-10-CM | POA: Diagnosis not present

## 2020-07-17 DIAGNOSIS — Z9889 Other specified postprocedural states: Secondary | ICD-10-CM | POA: Diagnosis not present

## 2020-07-17 DIAGNOSIS — Z8719 Personal history of other diseases of the digestive system: Secondary | ICD-10-CM | POA: Diagnosis not present

## 2020-07-17 DIAGNOSIS — N50812 Left testicular pain: Secondary | ICD-10-CM | POA: Diagnosis not present

## 2020-07-21 ENCOUNTER — Other Ambulatory Visit: Payer: Self-pay | Admitting: Surgery

## 2020-07-21 DIAGNOSIS — N50812 Left testicular pain: Secondary | ICD-10-CM

## 2020-07-31 ENCOUNTER — Ambulatory Visit
Admission: RE | Admit: 2020-07-31 | Discharge: 2020-07-31 | Disposition: A | Discharge status: Home / Self Care | Payer: BC Managed Care – PPO | Source: Ambulatory Visit | Attending: Surgery | Admitting: Surgery

## 2020-07-31 DIAGNOSIS — N50812 Left testicular pain: Secondary | ICD-10-CM | POA: Diagnosis not present

## 2020-08-04 ENCOUNTER — Other Ambulatory Visit: Payer: Self-pay | Admitting: Surgery

## 2020-08-04 DIAGNOSIS — N5082 Scrotal pain: Secondary | ICD-10-CM

## 2020-08-20 ENCOUNTER — Ambulatory Visit
Admission: RE | Admit: 2020-08-20 | Discharge: 2020-08-20 | Disposition: A | Discharge status: Home / Self Care | Payer: BLUE CROSS/BLUE SHIELD | Source: Ambulatory Visit | Attending: Surgery | Admitting: Surgery

## 2020-08-20 ENCOUNTER — Other Ambulatory Visit: Payer: Self-pay

## 2020-08-20 DIAGNOSIS — I861 Scrotal varices: Secondary | ICD-10-CM | POA: Diagnosis not present

## 2020-08-20 DIAGNOSIS — N5082 Scrotal pain: Secondary | ICD-10-CM

## 2020-08-20 MED ORDER — GADOBENATE DIMEGLUMINE 529 MG/ML IV SOLN
20.0000 mL | Freq: Once | INTRAVENOUS | Status: AC | PRN
  Administered 2020-08-20: 20 mL via INTRAVENOUS

## 2020-08-21 NOTE — Progress Notes (Signed)
Please call the patient and let them know that their MRI showed a varicocele on the left side.  He needs to be referred to somebody at [facility] for this problem.  No follow-up needed with me.

## 2020-10-06 DIAGNOSIS — N50812 Left testicular pain: Secondary | ICD-10-CM | POA: Diagnosis not present

## 2020-10-06 DIAGNOSIS — I861 Scrotal varices: Secondary | ICD-10-CM | POA: Diagnosis not present

## 2020-12-23 DIAGNOSIS — J0100 Acute maxillary sinusitis, unspecified: Secondary | ICD-10-CM | POA: Diagnosis not present

## 2021-01-30 DIAGNOSIS — L02416 Cutaneous abscess of left lower limb: Secondary | ICD-10-CM | POA: Diagnosis not present

## 2021-02-03 ENCOUNTER — Ambulatory Visit (HOSPITAL_COMMUNITY)
Admission: RE | Admit: 2021-02-03 | Discharge: 2021-02-03 | Disposition: A | Discharge status: Home / Self Care | Payer: BC Managed Care – PPO | Source: Ambulatory Visit | Attending: Physician Assistant | Admitting: Physician Assistant

## 2021-02-03 ENCOUNTER — Other Ambulatory Visit: Payer: Self-pay | Admitting: Physician Assistant

## 2021-02-03 ENCOUNTER — Other Ambulatory Visit: Payer: Self-pay

## 2021-02-03 DIAGNOSIS — I10 Essential (primary) hypertension: Secondary | ICD-10-CM | POA: Diagnosis not present

## 2021-02-03 DIAGNOSIS — M79662 Pain in left lower leg: Secondary | ICD-10-CM | POA: Insufficient documentation

## 2021-02-03 DIAGNOSIS — M25552 Pain in left hip: Secondary | ICD-10-CM | POA: Diagnosis not present

## 2021-02-03 DIAGNOSIS — M7989 Other specified soft tissue disorders: Secondary | ICD-10-CM

## 2021-02-03 DIAGNOSIS — L02416 Cutaneous abscess of left lower limb: Secondary | ICD-10-CM | POA: Diagnosis not present

## 2021-02-03 DIAGNOSIS — M7042 Prepatellar bursitis, left knee: Secondary | ICD-10-CM | POA: Diagnosis not present

## 2021-02-03 DIAGNOSIS — M25562 Pain in left knee: Secondary | ICD-10-CM | POA: Diagnosis not present

## 2021-02-05 DIAGNOSIS — M25552 Pain in left hip: Secondary | ICD-10-CM | POA: Diagnosis not present

## 2021-02-05 DIAGNOSIS — I10 Essential (primary) hypertension: Secondary | ICD-10-CM | POA: Diagnosis not present

## 2021-02-05 DIAGNOSIS — B9689 Other specified bacterial agents as the cause of diseases classified elsewhere: Secondary | ICD-10-CM | POA: Diagnosis not present

## 2021-02-05 DIAGNOSIS — M71162 Other infective bursitis, left knee: Secondary | ICD-10-CM | POA: Diagnosis not present

## 2021-02-05 DIAGNOSIS — R748 Abnormal levels of other serum enzymes: Secondary | ICD-10-CM | POA: Diagnosis not present

## 2021-02-11 DIAGNOSIS — R748 Abnormal levels of other serum enzymes: Secondary | ICD-10-CM | POA: Diagnosis not present

## 2021-03-03 DIAGNOSIS — M25552 Pain in left hip: Secondary | ICD-10-CM | POA: Diagnosis not present

## 2021-05-21 DIAGNOSIS — M7042 Prepatellar bursitis, left knee: Secondary | ICD-10-CM | POA: Diagnosis not present

## 2021-05-26 DIAGNOSIS — E559 Vitamin D deficiency, unspecified: Secondary | ICD-10-CM | POA: Diagnosis not present

## 2021-05-26 DIAGNOSIS — Z0000 Encounter for general adult medical examination without abnormal findings: Secondary | ICD-10-CM | POA: Diagnosis not present

## 2021-05-26 DIAGNOSIS — E7800 Pure hypercholesterolemia, unspecified: Secondary | ICD-10-CM | POA: Diagnosis not present

## 2021-05-28 DIAGNOSIS — M7042 Prepatellar bursitis, left knee: Secondary | ICD-10-CM | POA: Diagnosis not present

## 2021-06-02 DIAGNOSIS — Z0000 Encounter for general adult medical examination without abnormal findings: Secondary | ICD-10-CM | POA: Diagnosis not present

## 2021-06-02 DIAGNOSIS — E7800 Pure hypercholesterolemia, unspecified: Secondary | ICD-10-CM | POA: Diagnosis not present

## 2021-06-02 DIAGNOSIS — E559 Vitamin D deficiency, unspecified: Secondary | ICD-10-CM | POA: Diagnosis not present

## 2021-06-03 ENCOUNTER — Other Ambulatory Visit: Payer: Self-pay | Admitting: Registered Nurse

## 2021-06-03 DIAGNOSIS — E7800 Pure hypercholesterolemia, unspecified: Secondary | ICD-10-CM

## 2021-06-04 DIAGNOSIS — M7042 Prepatellar bursitis, left knee: Secondary | ICD-10-CM | POA: Diagnosis not present

## 2021-06-11 DIAGNOSIS — L814 Other melanin hyperpigmentation: Secondary | ICD-10-CM | POA: Diagnosis not present

## 2021-06-11 DIAGNOSIS — L918 Other hypertrophic disorders of the skin: Secondary | ICD-10-CM | POA: Diagnosis not present

## 2021-06-11 DIAGNOSIS — D1801 Hemangioma of skin and subcutaneous tissue: Secondary | ICD-10-CM | POA: Diagnosis not present

## 2021-06-11 DIAGNOSIS — Z85828 Personal history of other malignant neoplasm of skin: Secondary | ICD-10-CM | POA: Diagnosis not present

## 2021-06-11 DIAGNOSIS — L821 Other seborrheic keratosis: Secondary | ICD-10-CM | POA: Diagnosis not present

## 2021-07-15 ENCOUNTER — Ambulatory Visit
Admission: RE | Admit: 2021-07-15 | Discharge: 2021-07-15 | Disposition: A | Discharge status: Home / Self Care | Payer: No Typology Code available for payment source | Source: Ambulatory Visit | Attending: Registered Nurse | Admitting: Registered Nurse

## 2021-07-15 DIAGNOSIS — R911 Solitary pulmonary nodule: Secondary | ICD-10-CM | POA: Diagnosis not present

## 2021-07-15 DIAGNOSIS — E7800 Pure hypercholesterolemia, unspecified: Secondary | ICD-10-CM

## 2021-07-27 DIAGNOSIS — E559 Vitamin D deficiency, unspecified: Secondary | ICD-10-CM | POA: Diagnosis not present

## 2021-07-27 DIAGNOSIS — R5383 Other fatigue: Secondary | ICD-10-CM | POA: Diagnosis not present

## 2021-07-27 DIAGNOSIS — E7800 Pure hypercholesterolemia, unspecified: Secondary | ICD-10-CM | POA: Diagnosis not present

## 2021-08-03 DIAGNOSIS — I10 Essential (primary) hypertension: Secondary | ICD-10-CM | POA: Diagnosis not present

## 2021-08-03 DIAGNOSIS — R911 Solitary pulmonary nodule: Secondary | ICD-10-CM | POA: Diagnosis not present

## 2021-08-03 DIAGNOSIS — E7800 Pure hypercholesterolemia, unspecified: Secondary | ICD-10-CM | POA: Diagnosis not present

## 2021-08-03 DIAGNOSIS — E669 Obesity, unspecified: Secondary | ICD-10-CM | POA: Diagnosis not present

## 2021-10-15 DIAGNOSIS — M79641 Pain in right hand: Secondary | ICD-10-CM | POA: Diagnosis not present

## 2021-11-26 DIAGNOSIS — E559 Vitamin D deficiency, unspecified: Secondary | ICD-10-CM | POA: Diagnosis not present

## 2021-11-26 DIAGNOSIS — E7800 Pure hypercholesterolemia, unspecified: Secondary | ICD-10-CM | POA: Diagnosis not present

## 2021-11-30 DIAGNOSIS — E7800 Pure hypercholesterolemia, unspecified: Secondary | ICD-10-CM | POA: Diagnosis not present

## 2021-11-30 DIAGNOSIS — E669 Obesity, unspecified: Secondary | ICD-10-CM | POA: Diagnosis not present

## 2021-11-30 DIAGNOSIS — I10 Essential (primary) hypertension: Secondary | ICD-10-CM | POA: Diagnosis not present

## 2021-11-30 DIAGNOSIS — E559 Vitamin D deficiency, unspecified: Secondary | ICD-10-CM | POA: Diagnosis not present

## 2021-12-29 ENCOUNTER — Ambulatory Visit: Admission: EM | Admit: 2021-12-29 | Payer: BC Managed Care – PPO

## 2021-12-29 ENCOUNTER — Telehealth: Payer: BC Managed Care – PPO | Admitting: Urgent Care

## 2021-12-29 ENCOUNTER — Telehealth: Payer: BC Managed Care – PPO | Admitting: Physician Assistant

## 2021-12-29 DIAGNOSIS — U099 Post covid-19 condition, unspecified: Secondary | ICD-10-CM | POA: Diagnosis not present

## 2021-12-29 DIAGNOSIS — R051 Acute cough: Secondary | ICD-10-CM | POA: Diagnosis not present

## 2021-12-29 DIAGNOSIS — R509 Fever, unspecified: Secondary | ICD-10-CM | POA: Diagnosis not present

## 2021-12-29 DIAGNOSIS — J069 Acute upper respiratory infection, unspecified: Secondary | ICD-10-CM

## 2021-12-29 MED ORDER — ALBUTEROL SULFATE HFA 108 (90 BASE) MCG/ACT IN AERS: 2.0000 | INHALATION_SPRAY | Freq: Four times a day (QID) | RESPIRATORY_TRACT | 0 refills | Status: DC | PRN

## 2021-12-29 MED ORDER — FLUTICASONE PROPIONATE 50 MCG/ACT NA SUSP: 2.0000 | Freq: Every day | NASAL | 0 refills | Status: DC

## 2021-12-29 MED ORDER — AZITHROMYCIN 250 MG PO TABS: ORAL_TABLET | ORAL | 0 refills | Status: AC

## 2021-12-29 MED ORDER — BENZONATATE 100 MG PO CAPS: 100.0000 mg | ORAL_CAPSULE | Freq: Three times a day (TID) | ORAL | 0 refills | Status: DC | PRN

## 2021-12-29 MED ORDER — PROMETHAZINE-DM 6.25-15 MG/5ML PO SYRP: 5.0000 mL | ORAL_SOLUTION | Freq: Four times a day (QID) | ORAL | 0 refills | Status: DC | PRN

## 2021-12-29 NOTE — Progress Notes (Signed)
E-Visit for Upper Respiratory Infection   We are sorry you are not feeling well.  Here is how we plan to help!  Based on what you have shared with me, it looks like you may have a viral upper respiratory infection.  Upper respiratory infections are caused by a large number of viruses; however, rhinovirus is the most common cause.   Symptoms vary from person to person, with common symptoms including sore throat, cough, fatigue or lack of energy and feeling of general discomfort.  A low-grade fever of up to 100.4 may present, but is often uncommon.  Symptoms vary however, and are closely related to a person's age or underlying illnesses.  The most common symptoms associated with an upper respiratory infection are nasal discharge or congestion, cough, sneezing, headache and pressure in the ears and face.  These symptoms usually persist for about 3 to 10 days, but can last up to 2 weeks.  It is important to know that upper respiratory infections do not cause serious illness or complications in most cases.    Upper respiratory infections can be transmitted from person to person, with the most common method of transmission being a person's hands.  The virus is able to live on the skin and can infect other persons for up to 2 hours after direct contact.  Also, these can be transmitted when someone coughs or sneezes; thus, it is important to cover the mouth to reduce this risk.  To keep the spread of the illness at [place], good hand hygiene is very important.  This is an infection that is most likely caused by a virus. There are no specific treatments other than to help you with the symptoms until the infection runs its course.  We are sorry you are not feeling well.  Here is how we plan to help!   For nasal congestion, you may use an oral decongestants such as Mucinex D or if you have glaucoma or high blood pressure use plain Mucinex.  Saline nasal spray or nasal drops can help and can safely be used as often as  needed for congestion.  For your congestion, I have prescribed Fluticasone nasal spray one spray in each nostril twice a day  If you do not have a history of heart disease, hypertension, diabetes or thyroid disease, prostate/bladder issues or glaucoma, you may also use Sudafed to treat nasal congestion.  It is highly recommended that you consult with a pharmacist or your primary care physician to ensure this medication is safe for you to take.     If you have a cough, you may use cough suppressants such as Delsym and Robitussin.  If you have glaucoma or high blood pressure, you can also use Coricidin HBP.   For cough I have prescribed for you A prescription cough medication called Tessalon Perles 100 mg. You may take 1-2 capsules every 8 hours as needed for cough  I have also sent in a prescription for albuterol to use as directed for chest tightness and wheezing.   If you have a sore or scratchy throat, use a saltwater gargle-  to  teaspoon of salt dissolved in a 4-ounce to 8-ounce glass of warm water.  Gargle the solution for approximately 15-30 seconds and then spit.  It is important not to swallow the solution.  You can also use throat lozenges/cough drops and Chloraseptic spray to help with throat pain or discomfort.  Warm or cold liquids can also be helpful in relieving throat pain.  For headache, pain or general discomfort, you can use Ibuprofen or Tylenol as directed.   Some authorities believe that zinc sprays or the use of Echinacea may shorten the course of your symptoms.   HOME CARE Only take medications as instructed by your medical team. Be sure to drink plenty of fluids. Water is fine as well as fruit juices, sodas and electrolyte beverages. You may want to stay away from caffeine or alcohol. If you are nauseated, try taking small sips of liquids. How do you know if you are getting enough fluid? Your urine should be a pale yellow or almost colorless. Get rest. Taking a steamy  shower or using a humidifier may help nasal congestion and ease sore throat pain. You can place a towel over your head and breathe in the steam from hot water coming from a faucet. Using a saline nasal spray works much the same way. Cough drops, hard candies and sore throat lozenges may ease your cough. Avoid close contacts especially the very young and the elderly Cover your mouth if you cough or sneeze Always remember to wash your hands.   GET HELP RIGHT AWAY IF: You develop worsening fever. If your symptoms do not improve within 10 days You develop yellow or green discharge from your nose over 3 days. You have coughing fits You develop a severe head ache or visual changes. You develop shortness of breath, difficulty breathing or start having chest pain Your symptoms persist after you have completed your treatment plan  MAKE SURE YOU  Understand these instructions. Will watch your condition. Will get help right away if you are not doing well or get worse.  Thank you for choosing an e-visit.  Your e-visit answers were reviewed by a board certified advanced clinical practitioner to complete your personal care plan. Depending upon the condition, your plan could have included both over the counter or prescription medications.  Please review your pharmacy choice. Make sure the pharmacy is open so you can pick up prescription now. If there is a problem, you may contact your provider through [employer] and have the prescription routed to another pharmacy.  Your safety is important to [place]. If you have drug allergies check your prescription carefully.   For the next 24 hours you can use MyChart to ask questions about today's visit, request a non-urgent call back, or ask for a work or school excuse. You will get an email in the next two days asking about your experience. I hope that your e-visit has been valuable and will speed your recovery.

## 2021-12-29 NOTE — Progress Notes (Signed)
Virtual Visit Consent   David Webb, you are scheduled for a virtual visit with a [facility] provider today. Just as with appointments in the office, your consent must be obtained to participate. Your consent will be active for this visit and any virtual visit you may have with one of our providers in the next 365 days. If you have a MyChart account, a copy of this consent can be sent to you electronically.  As this is a virtual visit, video technology does not allow for your provider to perform a traditional examination. This may limit your provider's ability to fully assess your condition. If your provider identifies any concerns that need to be evaluated in person or the need to arrange testing (such as labs, EKG, etc.), we will make arrangements to do so. Although advances in technology are sophisticated, we cannot ensure that it will always work on either your end or our end. If the connection with a video visit is poor, the visit may have to be switched to a telephone visit. With either a video or telephone visit, we are not always able to ensure that we have a secure connection.  By engaging in this virtual visit, you consent to the provision of healthcare and authorize for your insurance to be billed (if applicable) for the services provided during this visit. Depending on your insurance coverage, you may receive a charge related to this service.  I need to obtain your verbal consent now. Are you willing to proceed with your visit today? David Webb has provided verbal consent on 12/29/2021 for a virtual visit (video or telephone). Chaney Malling, PA  Date: 12/29/2021 5:39 PM  Virtual Visit via Video Note   I, [place], connected with  David Webb  ([id number], [birth year]) on 12/29/21 at  6:15 PM EST by a video-enabled telemedicine application and verified that I am speaking with the correct person using two identifiers.  Location: Patient: Virtual Visit Location Patient:  Home Provider: Virtual Visit Location Provider: Home Office   I discussed the limitations of evaluation and management by telemedicine and the availability of in person appointments. The patient expressed understanding and agreed to proceed.    History of Present Illness: David Webb is a 42 y.o. who identifies as a male who was assigned male at birth, and is being seen today for cough and fever.  HPI: 41yo male presents today with concerns of severe cough. States he was sick for two weeks, tested positive for covid last week. He is uncertain which day he actually contracted covid, so stayed home all last week in isolation. On Sunday re-took covid test, was now negative. Went to work yesterday feeling pretty good. This morning however, severe cough started - "felt like it was moving bones in my head."  Had a low grade fever this morning, most recently 101.4. No cough or fever when positive for covid, these symptoms are new. No chronic pulmonary issues. Has SOB during coughing paroxysms, but otherwise denies tightness in chest or chest pain/ palpitations. No GI sx, rash, or sinus pain. Pt admits he is currently trying to wean off benzos and is uncertain if this may be causing some symptoms. No seizures. Took a 1/2 ativan 1 hour ago.    Problems: There are no problems to display for this patient.   Allergies:  Allergies  Allergen Reactions   Percocet [Oxycodone-Acetaminophen] Other (See Comments)    Intense dreams/night sweats   Medications:  Current  Outpatient Medications:    amLODipine (NORVASC) 5 MG tablet, Take 5 mg by mouth daily., Disp: , Rfl:    azithromycin (ZITHROMAX) 250 MG tablet, Take 2 tablets on day 1, then 1 tablet daily on days 2 through 5, Disp: 6 tablet, Rfl: 0   promethazine-dextromethorphan (PROMETHAZINE-DM) 6.25-15 MG/5ML syrup, Take 5 mLs by mouth 4 (four) times daily as needed for cough., Disp: 118 mL, Rfl: 0   ALPRAZolam (XANAX) 0.5 MG tablet, Take 0.5 mg by mouth 2  (two) times daily as needed., Disp: , Rfl:    omeprazole (PRILOSEC) 20 MG capsule, Take 1 capsule (20 mg total) by mouth daily., Disp: 30 capsule, Rfl: 0   rosuvastatin (CRESTOR) 20 MG tablet, Take 20 mg by mouth daily., Disp: , Rfl:   Observations/Objective: Patient is well-developed, well-nourished in no acute distress.  Resting comfortably in bed at home.  Head is normocephalic, atraumatic.  No labored breathing. Deep harsh cough during encounter. No tachypnea or accessory muscle use. No pursing of lips. Speech is clear and coherent with logical content.  Patient is alert and oriented at baseline.  No rhinorrhea, rash or scleral injection.  Assessment and Plan: 1. Post-acute COVID-19 syndrome  2. Fever, unspecified fever cause  3. Acute cough  Pt with symptoms concerning for possible post-covid respiratory infection. Due to his hx of anxiety, will defer use of steroids. Will start azithromycin and promethazine cough syrup. Pt encouraged to alterate tylenol and ibuprofen for fever. Humidification to help with any congestion.   Follow Up Instructions: I discussed the assessment and treatment plan with the patient. The patient was provided an opportunity to ask questions and all were answered. The patient agreed with the plan and demonstrated an understanding of the instructions.  A copy of instructions were sent to the patient via MyChart unless otherwise noted below.    The patient was advised to call back or seek an in-person evaluation if the symptoms worsen or if the condition fails to improve as anticipated.  Time:  I spent 8 minutes with the patient via telehealth technology discussing the above problems/concerns.    [place], PA

## 2021-12-29 NOTE — Patient Instructions (Signed)
Bo Mcclintock, thank you for joining Chaney Malling, PA for today's virtual visit.  While this provider is not your primary care provider (PCP), if your PCP is located in our provider database this encounter information will be shared with them immediately following your visit.   [facility] account gives you access to today's visit and all your visits, tests, and labs performed at [facility] " click here if you don't have a [facility] account or go to [email or web address]  Consent: (Patient) Shammond Arave Beman provided verbal consent for this virtual visit at the beginning of the encounter.  Current Medications:  Current Outpatient Medications:    amLODipine (NORVASC) 5 MG tablet, Take 5 mg by mouth daily., Disp: , Rfl:    azithromycin (ZITHROMAX) 250 MG tablet, Take 2 tablets on day 1, then 1 tablet daily on days 2 through 5, Disp: 6 tablet, Rfl: 0   promethazine-dextromethorphan (PROMETHAZINE-DM) 6.25-15 MG/5ML syrup, Take 5 mLs by mouth 4 (four) times daily as needed for cough., Disp: 118 mL, Rfl: 0   ALPRAZolam (XANAX) 0.5 MG tablet, Take 0.5 mg by mouth 2 (two) times daily as needed., Disp: , Rfl:    omeprazole (PRILOSEC) 20 MG capsule, Take 1 capsule (20 mg total) by mouth daily., Disp: 30 capsule, Rfl: 0   rosuvastatin (CRESTOR) 20 MG tablet, Take 20 mg by mouth daily., Disp: , Rfl:    Medications ordered in this encounter:  Meds ordered this encounter  Medications   azithromycin (ZITHROMAX) 250 MG tablet    Sig: Take 2 tablets on day 1, then 1 tablet daily on days 2 through 5    Dispense:  6 tablet    Refill:  0    Order Specific Question:   Supervising Provider    Answer:   Chase Picket [[id number]]   promethazine-dextromethorphan (PROMETHAZINE-DM) 6.25-15 MG/5ML syrup    Sig: Take 5 mLs by mouth 4 (four) times daily as needed for cough.    Dispense:  118 mL    Refill:  0    Order Specific Question:   Supervising Provider    Answer:    Chase Picket [[id number]]     *If you need refills on other medications prior to your next appointment, please contact your pharmacy*  Follow-Up: Call back or seek an in-person evaluation if the symptoms worsen or if the condition fails to improve as anticipated.  [place] [phone]  Other Instructions Please start taking the azithromycin per package instructions.  This will cover for suspected post-covid bacterial lung infection. Use the cough syrup on an as needed basis. This medication may make you drowsy so use with caution. You can alternate take '1000mg'$  Tylenol every 8 hours with '800mg'$  Ibuprofen every 8 hours. (Every four hours take the alternate) If you develop any chest pain, palpitations, or persistent fever not responsive to fever reducing medications, please head to the ER.   If you have been instructed to have an in-person evaluation today at a local Urgent Care facility, please use the link below. It will take you to a list of all of our available [facility], including address, phone number and hours of operation. Please do not delay care.  [facility]  If you or a family member do not have a primary care provider, use the link below to schedule a visit and establish care. When you choose a [facility] primary care physician or advanced practice  provider, you gain a long-term partner in health. Find a Primary Care Provider  Learn more about [facility]'s in-office and virtual care options: [facility]

## 2021-12-29 NOTE — Progress Notes (Signed)
I have spent 5 minutes in review of e-visit questionnaire, review and updating patient chart, medical decision making and response to patient.   January Bergthold Cody Tehya Leath, PA-C    

## 2022-02-14 IMAGING — MR MR PELVIS WO/W CM
8 of 17 series · 23 of 48 positions shown · IV contrast (multihance)
Comparison: None.

CLINICAL DATA: Left scrotal pain for several weeks.

EXAM:
MRI PELVIS WITHOUT AND WITH CONTRAST
TECHNIQUE: Multiplanar multisequence MR imaging of the pelvis was performed,
which was tailored to the testicles and scrotum, both before and
after administration of intravenous contrast.
CONTRAST:  20mL MULTIHANCE GADOBENATE DIMEGLUMINE 529 MG/ML IV SOLN

[Series 2: T2 · axial · 4.0mm · 0.62mm/px · 1 of 25 slices shown (1 of 4)]
[im 1/25]
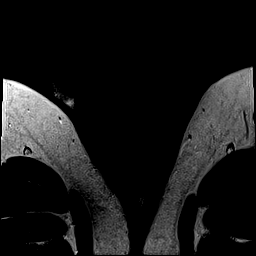

[Series 3: T2 · coronal · 4.0mm · 0.62mm/px · 1 of 34 slices shown (2 of 4)]
[im 1/34]
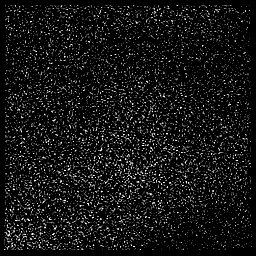

[Series 4: T2 · sagittal · 4.0mm · 0.62mm/px · 3 of 36 slices shown (3 of 4)]
[im 1/36]
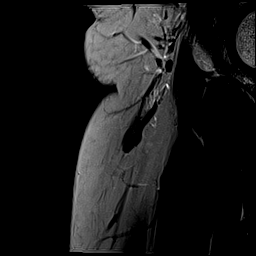
[im 18/36]
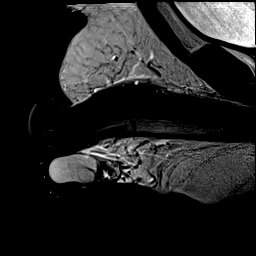
[im 36/36]
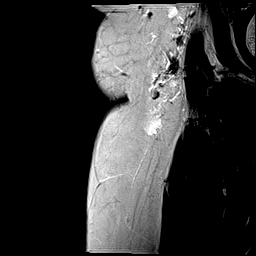

[Series 6: T1 · axial · 5.0mm · 0.78mm/px · z∈[-57,+188]mm · 3 of 36 slices shown]
[im 1/36]
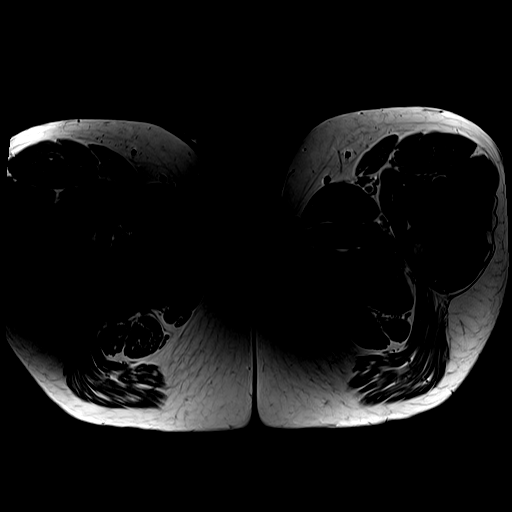
[im 18/36]
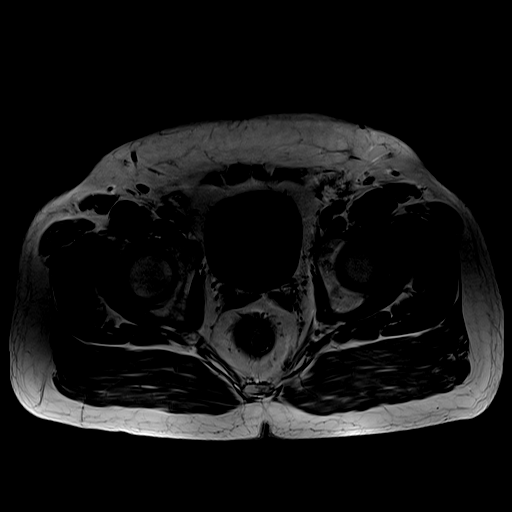
[im 36/36]
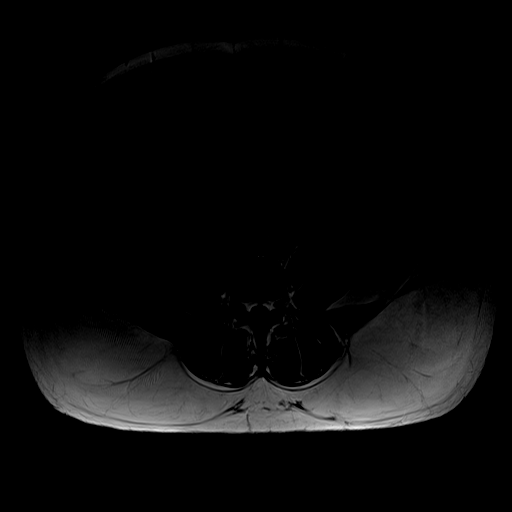

[Series 7: T2 · axial · 5.0mm · 0.78mm/px · z∈[-57,+188]mm · 3 of 36 slices shown (4 of 4)]
[im 1/36]
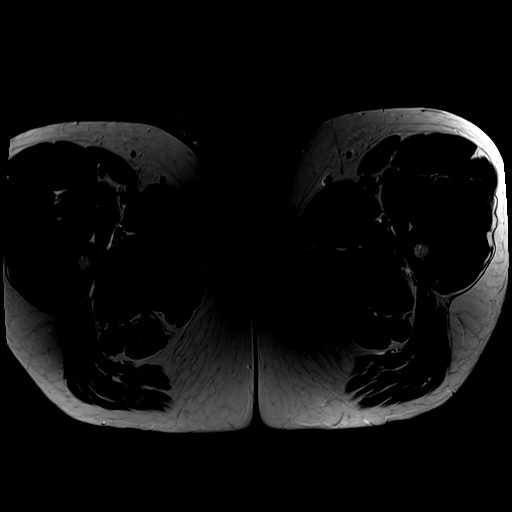
[im 18/36]
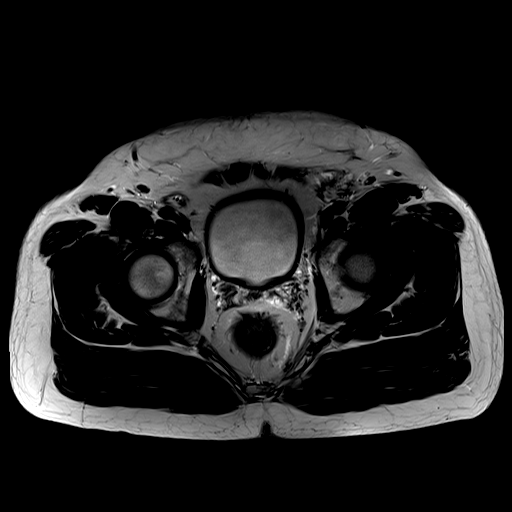
[im 36/36]
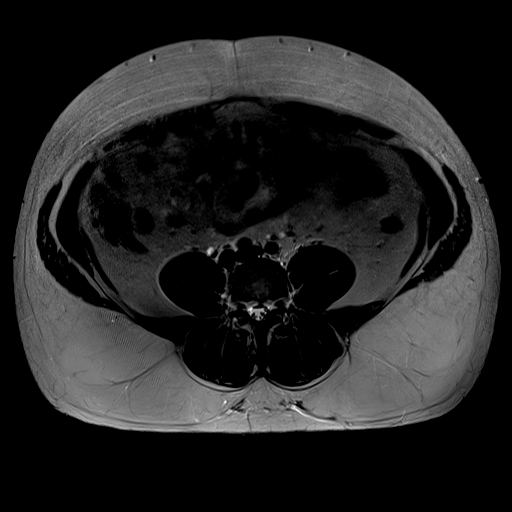

[Series 13: T1 dynamic · axial · non-contrast · 5.0mm · 0.78mm/px · z∈[-72,+183]mm · 4 of 52 slices shown (1 of 2)]
[im 1/52]
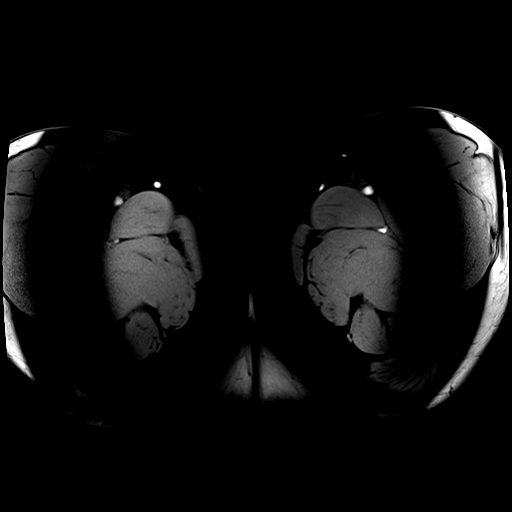
[im 18/52]
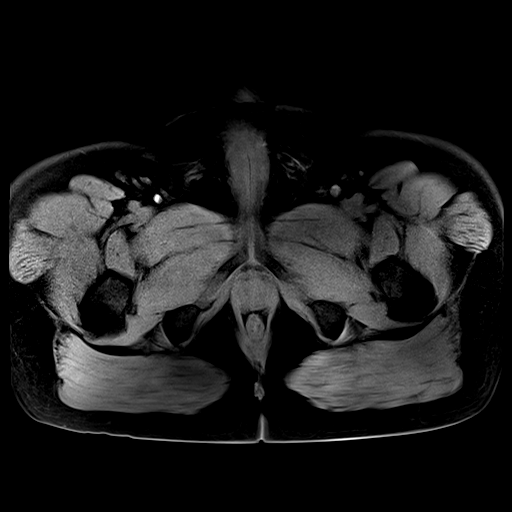
[im 35/52]
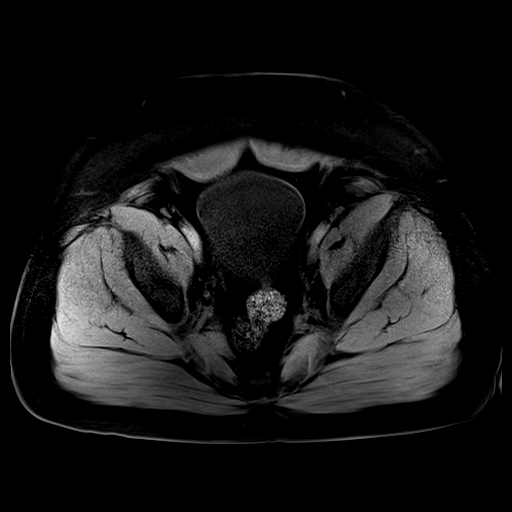
[im 52/52]
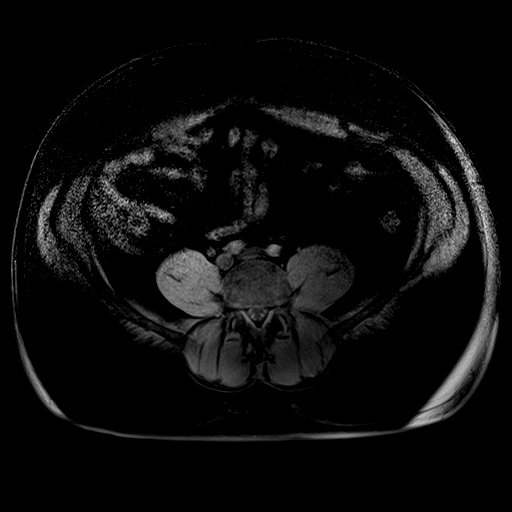

[Series 14: T1 dynamic post-contrast · axial · 5.0mm · 0.78mm/px · z∈[-72,+183]mm · 4 of 52 slices shown]
[im 1/52]
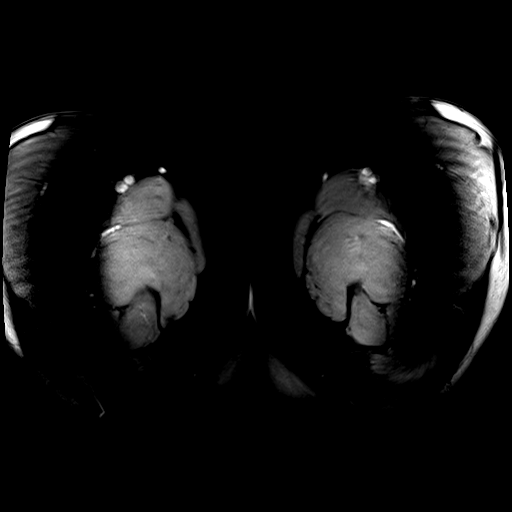
[im 18/52]
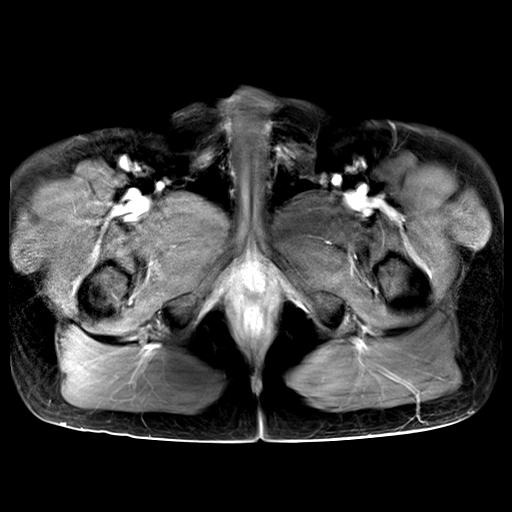
[im 35/52]
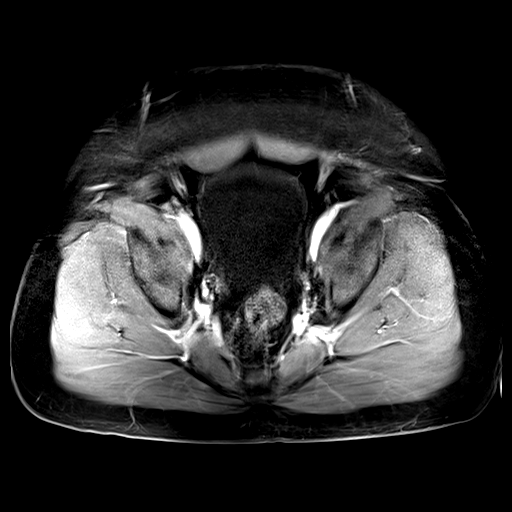
[im 52/52]
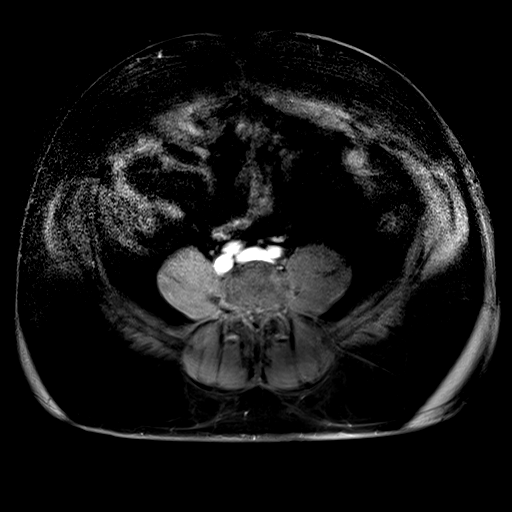

[Series 15: T1 dynamic · axial · 5.0mm · 0.78mm/px · z∈[-72,+183]mm · 4 of 52 slices shown (2 of 2)]
[im 1/52]
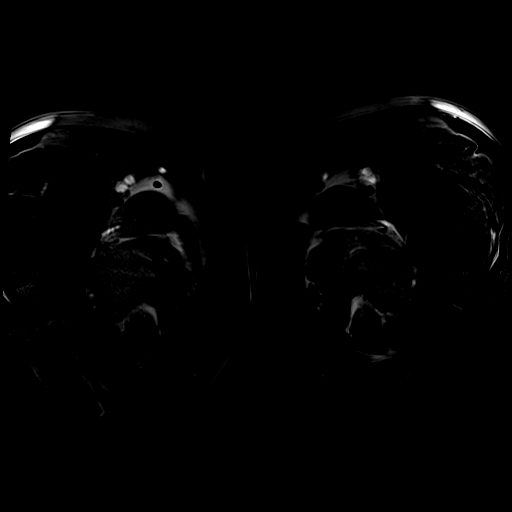
[im 18/52]
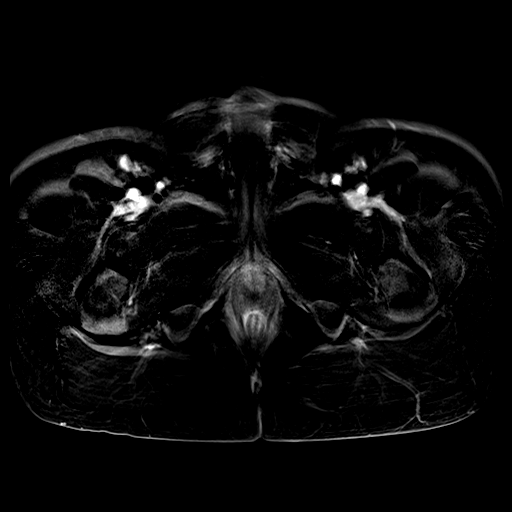
[im 35/52]
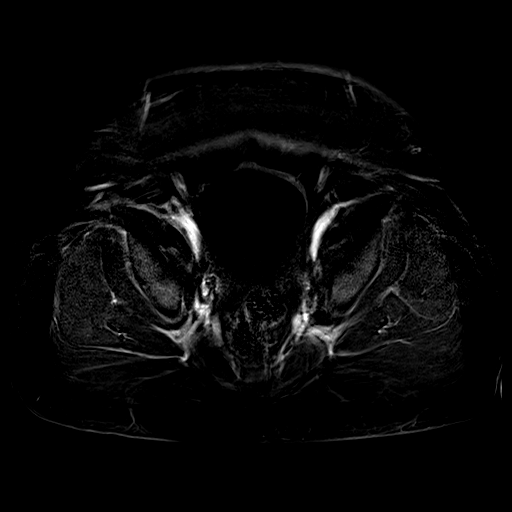
[im 52/52]
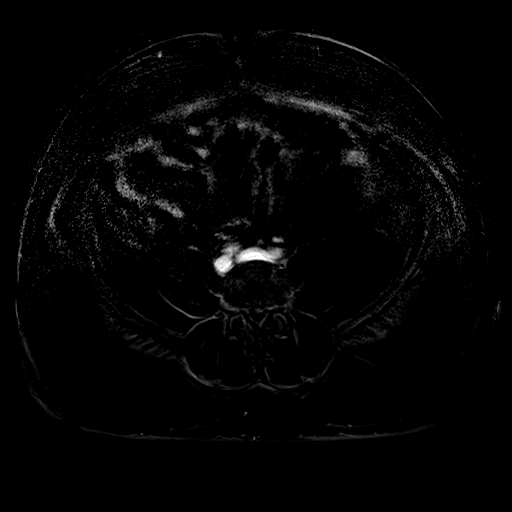

[23 of 48 positions shown; findings below may reference images not displayed]

FINDINGS: Lower Urinary Tract: Unremarkable urinary bladder.

Bowel: Unremarkable pelvic bowel loops.

Vascular/Lymphatic: No pathologically enlarged lymph nodes or other
significant abnormality.

Reproductive: Both testicles are normal in appearance. No evidence
of testicular mass. A moderate left-sided varicocele is seen. A
small right-sided varicocele is present. No evidence of hydrocele or
inflammatory process.

Other: None.

Musculoskeletal: No suspicious bone lesions identified.
IMPRESSION: Moderate left varicocele, and small right varicocele.

No evidence of testicular mass.

## 2022-06-16 DIAGNOSIS — E7800 Pure hypercholesterolemia, unspecified: Secondary | ICD-10-CM | POA: Diagnosis not present

## 2022-06-16 DIAGNOSIS — E559 Vitamin D deficiency, unspecified: Secondary | ICD-10-CM | POA: Diagnosis not present

## 2022-06-16 DIAGNOSIS — R5383 Other fatigue: Secondary | ICD-10-CM | POA: Diagnosis not present

## 2022-06-16 DIAGNOSIS — Z0000 Encounter for general adult medical examination without abnormal findings: Secondary | ICD-10-CM | POA: Diagnosis not present

## 2022-06-23 DIAGNOSIS — Z0000 Encounter for general adult medical examination without abnormal findings: Secondary | ICD-10-CM | POA: Diagnosis not present

## 2022-06-23 DIAGNOSIS — R911 Solitary pulmonary nodule: Secondary | ICD-10-CM | POA: Diagnosis not present

## 2022-06-23 DIAGNOSIS — E559 Vitamin D deficiency, unspecified: Secondary | ICD-10-CM | POA: Diagnosis not present

## 2022-06-23 DIAGNOSIS — I10 Essential (primary) hypertension: Secondary | ICD-10-CM | POA: Diagnosis not present

## 2022-07-21 ENCOUNTER — Other Ambulatory Visit: Payer: Self-pay | Admitting: Registered Nurse

## 2022-07-21 DIAGNOSIS — R911 Solitary pulmonary nodule: Secondary | ICD-10-CM

## 2022-07-26 DIAGNOSIS — M25571 Pain in right ankle and joints of right foot: Secondary | ICD-10-CM | POA: Diagnosis not present

## 2022-08-11 ENCOUNTER — Ambulatory Visit
Admission: RE | Admit: 2022-08-11 | Discharge: 2022-08-11 | Disposition: A | Discharge status: Home / Self Care | Payer: BC Managed Care – PPO | Source: Ambulatory Visit | Attending: Registered Nurse | Admitting: Registered Nurse

## 2022-08-11 DIAGNOSIS — R911 Solitary pulmonary nodule: Secondary | ICD-10-CM

## 2022-08-11 DIAGNOSIS — R918 Other nonspecific abnormal finding of lung field: Secondary | ICD-10-CM | POA: Diagnosis not present

## 2022-11-19 DIAGNOSIS — J069 Acute upper respiratory infection, unspecified: Secondary | ICD-10-CM | POA: Diagnosis not present

## 2022-12-22 DIAGNOSIS — E7800 Pure hypercholesterolemia, unspecified: Secondary | ICD-10-CM | POA: Diagnosis not present

## 2022-12-29 DIAGNOSIS — K219 Gastro-esophageal reflux disease without esophagitis: Secondary | ICD-10-CM | POA: Diagnosis not present

## 2022-12-29 DIAGNOSIS — F419 Anxiety disorder, unspecified: Secondary | ICD-10-CM | POA: Diagnosis not present

## 2022-12-29 DIAGNOSIS — E7800 Pure hypercholesterolemia, unspecified: Secondary | ICD-10-CM | POA: Diagnosis not present

## 2022-12-29 DIAGNOSIS — I10 Essential (primary) hypertension: Secondary | ICD-10-CM | POA: Diagnosis not present

## 2023-04-09 ENCOUNTER — Ambulatory Visit
Admission: EM | Admit: 2023-04-09 | Discharge: 2023-04-09 | Disposition: A | Discharge status: Home / Self Care | Attending: Nurse Practitioner | Admitting: Nurse Practitioner

## 2023-04-09 ENCOUNTER — Encounter: Payer: Self-pay | Admitting: Emergency Medicine

## 2023-04-09 DIAGNOSIS — Z79899 Other long term (current) drug therapy: Secondary | ICD-10-CM | POA: Diagnosis not present

## 2023-04-09 DIAGNOSIS — Z8659 Personal history of other mental and behavioral disorders: Secondary | ICD-10-CM

## 2023-04-09 DIAGNOSIS — J069 Acute upper respiratory infection, unspecified: Secondary | ICD-10-CM | POA: Diagnosis not present

## 2023-04-09 LAB — POC COVID19/FLU A&B COMBO
Covid Antigen, POC: NEGATIVE
Influenza A Antigen, POC: NEGATIVE
Influenza B Antigen, POC: NEGATIVE

## 2023-04-09 LAB — POCT RAPID STREP A (OFFICE): Rapid Strep A Screen: NEGATIVE

## 2023-04-09 NOTE — Discharge Instructions (Signed)
 The COVID/flu test was negative. You may take over-the-counter cough and cold medications that you have at home.  Take medications as directed. Recommend the use of normal saline nasal spray throughout the day for nasal congestion and runny nose. For your cough, it may also be helpful for her to use a humidifier during sleep.  With regard to the tapering of your Xanax.  Recommend discussing a tapering plan with your doctor regarding the Xanax.  Suggest starting an antidepressant in conjunction with tapering which may help with your symptoms. Follow-up as needed.

## 2023-04-09 NOTE — ED Provider Notes (Signed)
 [facility]    CSN: [id number] Arrival date & time: 04/09/23  1234      History   Chief Complaint Chief Complaint  Patient presents with   Cough   Sore Throat    HPI David Webb is a 43 y.o. male.   The history is provided by the patient.   Patient presents with a 2-day history of fatigue, sore throat, nasal congestion runny nose, cough, wheezing and abdominal pain.  Patient denies fever, chills, headache, ear pain, shortness of breath, difficulty breathing, nausea, vomiting, diarrhea, or rash.  Patient with history of anxiety, patient states that he does take Xanax daily, and has taken it for quite some time.  States that he has been trying to "wean" himself of the medication.  He states that he takes that as needed at bedtime, and has cut himself back on his daytime dose.  He states that he has noticed that he has had increased heart rate and his blood pressure has been more elevated.  He states that he is supposed to be taking a blood pressure medication, but his doctor took him off of it.  States that he took a home COVID test yesterday which was negative.  Also reports that his daughter was diagnosed with strep over the last week or so.  Past Medical History:  Diagnosis Date   GERD (gastroesophageal reflux disease)    OCC TUMS   Hypercholesterolemia     There are no active problems to display for this patient.   Past Surgical History:  Procedure Laterality Date   ANKLE SURGERY  2001   INGUINAL HERNIA REPAIR  05/12/2011   Procedure: HERNIA REPAIR INGUINAL ADULT;  Surgeon: Lodema Pilot, DO;  Location: [facility] OR;  Service: General;  Laterality: Left;   NOSE SURGERY     AGE 24        Home Medications    Prior to Admission medications   Medication Sig Start Date End Date Taking? Authorizing Provider  ALPRAZolam Prudy Feeler) 0.5 MG tablet Take 0.5 mg by mouth 2 (two) times daily as needed.    [provider]  omeprazole (PRILOSEC) 20 MG capsule Take 1  capsule (20 mg total) by mouth daily. 10/16/14   Marlon Pel, PA-C  rosuvastatin (CRESTOR) 20 MG tablet Take 20 mg by mouth daily.    [provider]    Family History History reviewed. No pertinent family history.  Social History Social History   Tobacco Use   Smoking status: Never   Smokeless tobacco: Never  Substance Use Topics   Alcohol use: Yes    Alcohol/week: 6.0 standard drinks of alcohol    Types: 6 Cans of beer per week    Comment: weekly   Drug use: No     Allergies   Percocet [oxycodone-acetaminophen]   Review of Systems Review of Systems Per HPI  Physical Exam Triage Vital Signs ED Triage Vitals  Encounter Vitals Group     BP 04/09/23 1333 (!) 148/89     Systolic BP Percentile --      Diastolic BP Percentile --      Pulse Rate 04/09/23 1333 72     Resp 04/09/23 1333 18     Temp 04/09/23 1333 97.9 F (36.6 C)     Temp Source 04/09/23 1333 Oral     SpO2 04/09/23 1333 97 %     Weight --      Height --      Head Circumference --  Peak Flow --      Pain Score 04/09/23 1334 0     Pain Loc --      Pain Education --      Exclude from Growth Chart --    No data found.  Updated Vital Signs BP (!) 148/89 (BP Location: Right Arm)   Pulse 72   Temp 97.9 F (36.6 C) (Oral)   Resp 18   SpO2 97%   Visual Acuity Right Eye Distance:   Left Eye Distance:   Bilateral Distance:    Right Eye Near:   Left Eye Near:    Bilateral Near:     Physical Exam Vitals and nursing note reviewed.  Constitutional:      General: He is not in acute distress.    Appearance: He is well-developed.  HENT:     Head: Normocephalic.     Right Ear: Tympanic membrane, ear canal and external ear normal.     Left Ear: Tympanic membrane, ear canal and external ear normal.     Nose: Congestion present.     Right Turbinates: Enlarged and swollen.     Left Turbinates: Enlarged and swollen.     Right Sinus: No maxillary sinus tenderness or frontal sinus  tenderness.     Left Sinus: No maxillary sinus tenderness or frontal sinus tenderness.     Mouth/Throat:     Lips: Pink.     Mouth: Mucous membranes are moist.     Pharynx: Uvula midline. Posterior oropharyngeal erythema and postnasal drip present. No pharyngeal swelling, oropharyngeal exudate or uvula swelling.     Comments: Cobblestoning present to posterior oropharynx  Eyes:     Extraocular Movements: Extraocular movements intact.     Pupils: Pupils are equal, round, and reactive to light.  Cardiovascular:     Rate and Rhythm: Normal rate and regular rhythm.     Pulses: Normal pulses.     Heart sounds: Normal heart sounds.  Pulmonary:     Effort: Pulmonary effort is normal. No respiratory distress.     Breath sounds: Normal breath sounds. No wheezing, rhonchi or rales.  Abdominal:     General: Bowel sounds are normal.     Palpations: Abdomen is soft.     Tenderness: There is no abdominal tenderness.  Musculoskeletal:     Cervical back: Normal range of motion.  Skin:    General: Skin is warm and dry.  Neurological:     General: No focal deficit present.     Mental Status: He is alert and oriented to person, place, and time.  Psychiatric:        Mood and Affect: Mood normal.        Behavior: Behavior normal.      UC Treatments / Results  Labs (all labs ordered are listed, but only abnormal results are displayed) Labs Reviewed  POCT RAPID STREP A (OFFICE) - Normal  POC COVID19/FLU A&B COMBO    EKG   Radiology No results found.  Procedures Procedures (including critical care time)  Medications Ordered in UC Medications - No data to display  Initial Impression / Assessment and Plan / UC Course  I have reviewed the triage vital signs and the nursing notes.  Pertinent labs & imaging results that were available during my care of the patient were reviewed by me and considered in my medical decision making (see chart for details).  COVID/flu test was negative.   Patient declines additional medication prescriptions at this time.  Supportive  care recommendations were provided and discussed with the patient to include fluids, rest, over-the-counter analgesics, warm salt water gargles, normal including nasal spray, and use of a humidifier for his cough.  Patient advised he can take over-the-counter cough and cold medications for symptoms, provided indications for patient regarding follow-up.  Patient advised to discuss tapering of Xanax with his PCP.  Advised patient to discuss with the doctor to start him a "noncontrolled" medication while tapering the Xanax instead of stopping the Xanax abruptly.  Patient was in agreement with this plan of care and verbalized understanding.  All questions were answered.  Patient stable for discharge.   Final Clinical Impressions(s) / UC Diagnoses   Final diagnoses:  None   Discharge Instructions   None    ED Prescriptions   None    PDMP not reviewed this encounter.   David Cantor, NP 04/09/23 1406

## 2023-04-09 NOTE — ED Triage Notes (Signed)
 Fatigue, cough, wheezing, stomach pain, sore throat since Thursday.    Home covid test yesterday was negative.

## 2023-05-10 DIAGNOSIS — M19031 Primary osteoarthritis, right wrist: Secondary | ICD-10-CM | POA: Diagnosis not present

## 2023-06-29 DIAGNOSIS — E559 Vitamin D deficiency, unspecified: Secondary | ICD-10-CM | POA: Diagnosis not present

## 2023-06-29 DIAGNOSIS — E7800 Pure hypercholesterolemia, unspecified: Secondary | ICD-10-CM | POA: Diagnosis not present

## 2023-06-29 DIAGNOSIS — Z0000 Encounter for general adult medical examination without abnormal findings: Secondary | ICD-10-CM | POA: Diagnosis not present

## 2023-06-29 DIAGNOSIS — R5383 Other fatigue: Secondary | ICD-10-CM | POA: Diagnosis not present

## 2023-06-29 DIAGNOSIS — I10 Essential (primary) hypertension: Secondary | ICD-10-CM | POA: Diagnosis not present

## 2023-07-06 DIAGNOSIS — E559 Vitamin D deficiency, unspecified: Secondary | ICD-10-CM | POA: Diagnosis not present

## 2023-07-06 DIAGNOSIS — Z0000 Encounter for general adult medical examination without abnormal findings: Secondary | ICD-10-CM | POA: Diagnosis not present

## 2023-07-06 DIAGNOSIS — R911 Solitary pulmonary nodule: Secondary | ICD-10-CM | POA: Diagnosis not present

## 2023-08-22 DIAGNOSIS — M25572 Pain in left ankle and joints of left foot: Secondary | ICD-10-CM | POA: Diagnosis not present

## 2023-08-22 DIAGNOSIS — M79672 Pain in left foot: Secondary | ICD-10-CM | POA: Diagnosis not present

## 2023-11-29 DIAGNOSIS — C44619 Basal cell carcinoma of skin of left upper limb, including shoulder: Secondary | ICD-10-CM | POA: Diagnosis not present

## 2023-11-29 DIAGNOSIS — D225 Melanocytic nevi of trunk: Secondary | ICD-10-CM | POA: Diagnosis not present

## 2023-11-29 DIAGNOSIS — Z85828 Personal history of other malignant neoplasm of skin: Secondary | ICD-10-CM | POA: Diagnosis not present

## 2023-11-29 DIAGNOSIS — L814 Other melanin hyperpigmentation: Secondary | ICD-10-CM | POA: Diagnosis not present

## 2023-11-29 DIAGNOSIS — L821 Other seborrheic keratosis: Secondary | ICD-10-CM | POA: Diagnosis not present
